# Patient Record
Sex: Female | Born: 2000 | Hispanic: Yes | Marital: Married | Smoking: Never smoker
Health system: Southern US, Community
[De-identification: ages and names within clinical notes are randomized; demographics above are authoritative.]

## PROBLEM LIST (undated history)

## (undated) DIAGNOSIS — O169 Unspecified maternal hypertension, unspecified trimester: Secondary | ICD-10-CM

---

## 2021-03-03 ENCOUNTER — Encounter (HOSPITAL_BASED_OUTPATIENT_CLINIC_OR_DEPARTMENT_OTHER): Payer: Self-pay | Admitting: Urology

## 2021-03-03 ENCOUNTER — Emergency Department (HOSPITAL_BASED_OUTPATIENT_CLINIC_OR_DEPARTMENT_OTHER): Payer: Medicaid Other

## 2021-03-03 ENCOUNTER — Emergency Department (HOSPITAL_BASED_OUTPATIENT_CLINIC_OR_DEPARTMENT_OTHER)
Admission: EM | Admit: 2021-03-03 | Discharge: 2021-03-03 | Disposition: A | Discharge status: Home / Self Care | Payer: Medicaid Other | Attending: Emergency Medicine | Admitting: Emergency Medicine

## 2021-03-03 ENCOUNTER — Other Ambulatory Visit: Payer: Self-pay

## 2021-03-03 DIAGNOSIS — N760 Acute vaginitis: Secondary | ICD-10-CM | POA: Insufficient documentation

## 2021-03-03 DIAGNOSIS — K529 Noninfective gastroenteritis and colitis, unspecified: Secondary | ICD-10-CM | POA: Diagnosis not present

## 2021-03-03 DIAGNOSIS — B9689 Other specified bacterial agents as the cause of diseases classified elsewhere: Secondary | ICD-10-CM

## 2021-03-03 DIAGNOSIS — D72829 Elevated white blood cell count, unspecified: Secondary | ICD-10-CM | POA: Insufficient documentation

## 2021-03-03 DIAGNOSIS — R1032 Left lower quadrant pain: Secondary | ICD-10-CM

## 2021-03-03 DIAGNOSIS — R102 Pelvic and perineal pain: Secondary | ICD-10-CM | POA: Diagnosis present

## 2021-03-03 LAB — CBC WITH DIFFERENTIAL/PLATELET
Abs Immature Granulocytes: 0.02 10*3/uL (ref 0.00–0.07)
Basophils Absolute: 0.1 10*3/uL (ref 0.0–0.1)
Basophils Relative: 1 %
Eosinophils Absolute: 0.1 10*3/uL (ref 0.0–0.5)
Eosinophils Relative: 2 %
HCT: 38.7 % (ref 36.0–46.0)
Hemoglobin: 12.4 g/dL (ref 12.0–15.0)
Immature Granulocytes: 0 %
Lymphocytes Relative: 45 %
Lymphs Abs: 3.6 10*3/uL (ref 0.7–4.0)
MCH: 20.7 pg — ABNORMAL LOW (ref 26.0–34.0)
MCHC: 32 g/dL (ref 30.0–36.0)
MCV: 64.7 fL — ABNORMAL LOW (ref 80.0–100.0)
Monocytes Absolute: 0.5 10*3/uL (ref 0.1–1.0)
Monocytes Relative: 6 %
Neutro Abs: 3.7 10*3/uL (ref 1.7–7.7)
Neutrophils Relative %: 46 %
Platelets: 352 10*3/uL (ref 150–400)
RBC: 5.98 MIL/uL — ABNORMAL HIGH (ref 3.87–5.11)
RDW: 17.6 % — ABNORMAL HIGH (ref 11.5–15.5)
WBC: 7.9 10*3/uL (ref 4.0–10.5)
nRBC: 0 % (ref 0.0–0.2)

## 2021-03-03 LAB — COMPREHENSIVE METABOLIC PANEL
ALT: 15 U/L (ref 0–44)
AST: 19 U/L (ref 15–41)
Albumin: 4.1 g/dL (ref 3.5–5.0)
Alkaline Phosphatase: 83 U/L (ref 38–126)
Anion gap: 9 (ref 5–15)
BUN: 10 mg/dL (ref 6–20)
CO2: 23 mmol/L (ref 22–32)
Calcium: 9 mg/dL (ref 8.9–10.3)
Chloride: 102 mmol/L (ref 98–111)
Creatinine, Ser: 0.66 mg/dL (ref 0.44–1.00)
GFR, Estimated: >60 mL/min (ref >60)
Glucose, Bld: 100 mg/dL — ABNORMAL HIGH (ref 70–99)
Potassium: 3.7 mmol/L (ref 3.5–5.1)
Sodium: 134 mmol/L — ABNORMAL LOW (ref 135–145)
Total Bilirubin: 0.5 mg/dL (ref 0.3–1.2)
Total Protein: 7.5 g/dL (ref 6.5–8.1)

## 2021-03-03 LAB — URINALYSIS, ROUTINE W REFLEX MICROSCOPIC
Bilirubin Urine: NEGATIVE
Glucose, UA: NEGATIVE mg/dL
Hgb urine dipstick: NEGATIVE
Ketones, ur: NEGATIVE mg/dL
Nitrite: NEGATIVE
Protein, ur: NEGATIVE mg/dL
Specific Gravity, Urine: 1.015 (ref 1.005–1.030)
pH: 7.5 (ref 5.0–8.0)

## 2021-03-03 LAB — WET PREP, GENITAL
Sperm: NONE SEEN
Trich, Wet Prep: NONE SEEN
WBC, Wet Prep HPF POC: >=10 — AB (ref <10)
Yeast Wet Prep HPF POC: NONE SEEN

## 2021-03-03 LAB — URINALYSIS, MICROSCOPIC (REFLEX): RBC / HPF: NONE SEEN RBC/hpf (ref 0–5)

## 2021-03-03 LAB — PREGNANCY, URINE: Preg Test, Ur: NEGATIVE

## 2021-03-03 MED ORDER — METRONIDAZOLE 500 MG PO TABS: 500.0000 mg | ORAL_TABLET | Freq: Two times a day (BID) | ORAL | 0 refills | Status: AC

## 2021-03-03 MED ORDER — IOHEXOL 300 MG/ML  SOLN
100.0000 mL | Freq: Once | INTRAMUSCULAR | Status: AC | PRN
  Administered 2021-03-03: 100 mL via INTRAVENOUS

## 2021-03-03 MED ORDER — MORPHINE SULFATE (PF) 4 MG/ML IV SOLN
4.0000 mg | Freq: Once | INTRAVENOUS | Status: AC
Start: 2021-03-03 — End: 2021-03-03
  Administered 2021-03-03: 4 mg via INTRAVENOUS
  Filled 2021-03-03: qty 1

## 2021-03-03 MED ORDER — SODIUM CHLORIDE 0.9 % IV BOLUS
1000.0000 mL | Freq: Once | INTRAVENOUS | Status: AC
  Administered 2021-03-03: 1000 mL via INTRAVENOUS

## 2021-03-03 MED ORDER — METRONIDAZOLE 500 MG PO TABS
500.0000 mg | ORAL_TABLET | Freq: Two times a day (BID) | ORAL | 0 refills | Status: DC
Start: 2021-03-03 — End: 2021-03-03

## 2021-03-03 MED ORDER — CIPROFLOXACIN HCL 500 MG PO TABS
500.0000 mg | ORAL_TABLET | Freq: Two times a day (BID) | ORAL | 0 refills | Status: AC
Start: 2021-03-03 — End: 2021-03-13

## 2021-03-03 MED ORDER — METRONIDAZOLE 500 MG PO TABS
500.0000 mg | ORAL_TABLET | Freq: Once | ORAL | Status: AC
Start: 2021-03-03 — End: 2021-03-03
  Administered 2021-03-03: 500 mg via ORAL
  Filled 2021-03-03: qty 1

## 2021-03-03 MED ORDER — ONDANSETRON HCL 4 MG/2ML IJ SOLN
4.0000 mg | Freq: Once | INTRAMUSCULAR | Status: AC
  Administered 2021-03-03: 4 mg via INTRAVENOUS
  Filled 2021-03-03: qty 2

## 2021-03-03 NOTE — ED Provider Notes (Signed)
[facility] Provider Note   CSN: [id number] Arrival date & time: 03/03/21  2028     History  Chief Complaint  Patient presents with   Pelvic Pain    Kristy Mitchell is a 21 y.o. female.  HPI  Patient is a 21 year old female with 3 days of left sided pelvic pain.  She has not tried anything for this pain.  Never had pain like this before.  Pain is described as severe with movement and is sharp.  Her last menstrual cycle was normal duration and flow.  LMP 02/26/2021.  She is sexually active with her husband and they are trying to conceive.  Endorses nausea.  She is unsure if she is pregnant.  Denies vomiting, nausea, dysuria, constipation, back pain, flank pain, blood in her urine or stool and fever.  Notes that she does have pain with sex in certain positions.  She has at least one soft stool daily. There has been no vaginal discharge, vaginal bleeding or rectal pain. She is not concerned for STDs.   Has a headache that she is taking Excedrin with some relief.      Home Medications Prior to Admission medications   Medication Sig Start Date End Date Taking? Authorizing Provider  ciprofloxacin (CIPRO) 500 MG tablet Take 1 tablet (500 mg total) by mouth 2 (two) times daily for 10 days. 03/03/21 03/13/21 Yes Adalyna Godbee, DO  metroNIDAZOLE (FLAGYL) 500 MG tablet Take 1 tablet (500 mg total) by mouth 2 (two) times daily for 10 days. 03/03/21 03/13/21  Lyndee Hensen, DO      Allergies    Patient has no known allergies.    Review of Systems   Review of Systems  Constitutional:  Negative for appetite change and fever.  HENT:  Negative for sore throat.   Respiratory:  Negative for cough and shortness of breath.   Cardiovascular:  Negative for chest pain.  Gastrointestinal:  Positive for nausea. Negative for abdominal pain, constipation, diarrhea, rectal pain and vomiting.  Genitourinary:  Positive for dyspareunia and pelvic pain. Negative for dysuria,  flank pain, hematuria, vaginal bleeding and vaginal discharge.  Musculoskeletal:  Negative for back pain.  Skin:  Negative for rash.  Neurological:  Positive for headaches.  All other systems reviewed and are negative.  Physical Exam Updated Vital Signs BP (!) 113/93    Pulse 89    Temp 98 F (36.7 C) (Oral)    Resp 20    LMP 02/26/2021 (Exact Date)    SpO2 100%   Physical Exam Constitutional:      Appearance: Normal appearance. She is obese. She is not ill-appearing.  HENT:     Head: Normocephalic and atraumatic.     Nose: Nose normal.     Mouth/Throat:     Mouth: Mucous membranes are moist.     Pharynx: Oropharynx is clear.  Eyes:     General: No scleral icterus.    Extraocular Movements: Extraocular movements intact.     Conjunctiva/sclera: Conjunctivae normal.  Cardiovascular:     Rate and Rhythm: Normal rate and regular rhythm.     Pulses: Normal pulses.     Heart sounds: Normal heart sounds.  Pulmonary:     Effort: Pulmonary effort is normal. No respiratory distress.     Breath sounds: Normal breath sounds.  Abdominal:     General: Bowel sounds are normal.     Palpations: Abdomen is soft. There is no fluid wave or mass.  Tenderness: There is abdominal tenderness in the left upper quadrant and left lower quadrant. There is no right CVA tenderness or left CVA tenderness. Negative signs include McBurney's sign.  Genitourinary:    Comments: Off-white discharge in the vaginal vault, no CMT, no lesions, no adnexal masses  Musculoskeletal:        General: No tenderness. Normal range of motion.     Cervical back: Normal range of motion.  Skin:    General: Skin is warm and dry.     Capillary Refill: Capillary refill takes less than 2 seconds.     Coloration: Skin is not jaundiced.     Findings: No rash.  Neurological:     Mental Status: She is alert and oriented to person, place, and time.  Psychiatric:        Mood and Affect: Mood normal.        Behavior: Behavior  normal.    ED Results / Procedures / Treatments   Labs (all labs ordered are listed, but only abnormal results are displayed) Labs Reviewed  WET PREP, GENITAL - Abnormal; Notable for the following components:      Result Value   Clue Cells Wet Prep HPF POC PRESENT (*)    WBC, Wet Prep HPF POC >=10 (*)    All other components within normal limits  URINALYSIS, ROUTINE W REFLEX MICROSCOPIC - Abnormal; Notable for the following components:   APPearance HAZY (*)    Leukocytes,Ua SMALL (*)    All other components within normal limits  CBC WITH DIFFERENTIAL/PLATELET - Abnormal; Notable for the following components:   RBC 5.98 (*)    MCV 64.7 (*)    MCH 20.7 (*)    RDW 17.6 (*)    All other components within normal limits  COMPREHENSIVE METABOLIC PANEL - Abnormal; Notable for the following components:   Sodium 134 (*)    Glucose, Bld 100 (*)    All other components within normal limits  URINALYSIS, MICROSCOPIC (REFLEX) - Abnormal; Notable for the following components:   Bacteria, UA RARE (*)    All other components within normal limits  PREGNANCY, URINE  GC/CHLAMYDIA PROBE AMP () NOT AT [facility]    EKG None  Radiology CT ABDOMEN PELVIS W CONTRAST  Result Date: 03/03/2021 CLINICAL DATA:  Left lower quadrant pain for several days, initial encounter EXAM: CT ABDOMEN AND PELVIS WITH CONTRAST TECHNIQUE: Multidetector CT imaging of the abdomen and pelvis was performed using the standard protocol following bolus administration of intravenous contrast. RADIATION DOSE REDUCTION: This exam was performed according to the departmental dose-optimization program which includes automated exposure control, adjustment of the mA and/or kV according to patient size and/or use of iterative reconstruction technique. CONTRAST:  113mL OMNIPAQUE IOHEXOL 300 MG/ML  SOLN COMPARISON:  None. FINDINGS: Lower chest: No acute abnormality. Hepatobiliary: Mild fatty infiltration of the liver is noted. The  gallbladder is within normal limits. Pancreas: Unremarkable. No pancreatic ductal dilatation or surrounding inflammatory changes. Spleen: Normal in size without focal abnormality. Adrenals/Urinary Tract: The adrenal glands are unremarkable. Kidneys demonstrate a normal enhancement pattern bilaterally. No renal calculi or obstructive changes are seen. Ureters are within normal limits. The bladder is well distended. Stomach/Bowel: The appendix is well visualized and within normal limits. No obstructive or inflammatory changes of the colon are seen. Stomach is within normal limits. Small bowel demonstrates no obstructive changes although some very mild wall thickening is noted in the left mid abdomen in the jejunum which may represent a degree  of enteritis. The more distal small bowel is within normal limits. Vascular/Lymphatic: No significant vascular findings are present. No enlarged abdominal or pelvic lymph nodes. Reproductive: Uterus and bilateral adnexa are unremarkable. Other: No abdominal wall hernia or abnormality. No abdominopelvic ascites. Musculoskeletal: No acute or significant osseous findings. IMPRESSION: Mild wall thickening within the jejunum which may represent some mild enteritis. Fatty infiltration of the liver. No other focal abnormality is noted. Electronically Signed   By: Inez Catalina M.D.   On: 03/03/2021 23:05   US Renal  Result Date: 03/03/2021 CLINICAL DATA:  Left flank pain. EXAM: RENAL / URINARY TRACT ULTRASOUND COMPLETE COMPARISON:  None. FINDINGS: Right Kidney: Renal measurements: 10.1 x 4.9 x 4.6 cm = volume: 11.9 mL. Echogenicity within normal limits. No mass or hydronephrosis visualized. Left Kidney: Renal measurements: 10.4 x 4.9 x 4.8 cm = volume: 129 mL. Echogenicity within normal limits. No mass or hydronephrosis visualized. Bladder: Appears normal for degree of bladder distention. Other: None. IMPRESSION: Normal renal ultrasound. Electronically Signed   By: Ronney Asters M.D.    On: 03/03/2021 22:12   US PELVIC COMPLETE W TRANSVAGINAL AND TORSION R/O  Result Date: 03/03/2021 CLINICAL DATA:  Initial evaluation for acute left lower quadrant pain. EXAM: TRANSABDOMINAL AND TRANSVAGINAL ULTRASOUND OF PELVIS DOPPLER ULTRASOUND OF OVARIES TECHNIQUE: Both transabdominal and transvaginal ultrasound examinations of the pelvis were performed. Transabdominal technique was performed for global imaging of the pelvis including uterus, ovaries, adnexal regions, and pelvic cul-de-sac. It was necessary to proceed with endovaginal exam following the transabdominal exam to visualize the endometrium and ovaries. Color and duplex Doppler ultrasound was utilized to evaluate blood flow to the ovaries. COMPARISON:  None available. FINDINGS: Uterus Measurements: 5.9 x 3.4 x 5.3 cm = volume: 55.0 mL. Uterus is retroverted. No discrete fibroid or other myometrial abnormality. Endometrium Thickness: 6.7 mm.  No focal abnormality visualized. Right ovary Measurements: 3.4 x 2.0 x 1.1 cm = volume: 3.9 mL. Normal appearance/no adnexal mass. Left ovary Measurements: 3.2 x 1.7 x 2.1 cm = volume: 6.0 mL. Normal appearance/no adnexal mass. Pulsed Doppler evaluation of both ovaries demonstrates normal low-resistance arterial and venous waveforms. Other findings No abnormal free fluid. IMPRESSION: Normal pelvic ultrasound. No evidence for torsion or other acute abnormality. Electronically Signed   By: Jeannine Boga M.D.   On: 03/03/2021 22:20    Procedures   Medications Ordered in ED Medications  sodium chloride 0.9 % bolus 1,000 mL (1,000 mLs Intravenous New Bag/Given 03/03/21 2110)  morphine 4 MG/ML injection 4 mg (4 mg Intravenous Given 03/03/21 2112)  ondansetron (ZOFRAN) injection 4 mg (4 mg Intravenous Given 03/03/21 2112)  iohexol (OMNIPAQUE) 300 MG/ML solution 100 mL (100 mLs Intravenous Contrast Given 03/03/21 2250)  metroNIDAZOLE (FLAGYL) tablet 500 mg (500 mg Oral Given 03/03/21 2316)    ED  Course/ Medical Decision Making/ A&P                           Medical Decision Making Amount and/or Complexity of Data Reviewed Independent Historian: spouse External Data Reviewed: labs and notes. Labs: ordered. Radiology: ordered. ECG/medicine tests: ordered.  Risk Prescription drug management.    Pt is a 21 yo female with 3 days of worsening pelvic pain.  DDX includes ovarian cyst, ovarian torsion, STI, pregnancy, nephrolithiasis, renal colic. Pain control with morphine. Provided fluid bolus and Zofran.    No leukocytosis on CBC. Profound microcytosis seen on previous labs. Urine pregnancy negative and no IUP  seen on ultrasound. UA dirty catch showing small amount of leukocytes. Serum creatine normal. Renal and pelvic ultrasound unremarkable.  GC/CT collected. Wet prep consistent with bacterial vaginosis. CT ABD/Pelvis concerning for colitis. Will treat with Cipro and Flagyl. Follow up with PCP as needed. Pt agrees with plan.    Final Clinical Impression(s) / ED Diagnoses Final diagnoses:  Colitis  BV (bacterial vaginosis)    Rx / DC Orders ED Discharge Orders          Ordered    metroNIDAZOLE (FLAGYL) 500 MG tablet  2 times daily,   Status:  Discontinued        03/03/21 2315    ciprofloxacin (CIPRO) 500 MG tablet  2 times daily        03/03/21 2315    metroNIDAZOLE (FLAGYL) 500 MG tablet  2 times daily        03/03/21 2316              Lyndee Hensen, DO 03/03/21 2320    Drenda Freeze, MD 03/10/21 [phone]

## 2021-03-03 NOTE — ED Triage Notes (Signed)
"  Pain in my left ovary" x 3 days Denies any dysuria  Denies vaginal discharge Normal BM

## 2021-03-03 NOTE — Discharge Instructions (Addendum)
Stop by the pharmacy to pick up your antibiotics. Take twice a day for the next 10 days. Follow up with your PCP as needed.   Best wishes on getting pregnant soon!   Take Care,   Dr Rachael Darby

## 2021-03-05 LAB — GC/CHLAMYDIA PROBE AMP (~~LOC~~) NOT AT ARMC
Chlamydia: NEGATIVE
Comment: NEGATIVE
Comment: NORMAL
Neisseria Gonorrhea: NEGATIVE

## 2021-03-15 ENCOUNTER — Ambulatory Visit: Payer: Medicaid Other | Admitting: Internal Medicine

## 2021-03-15 NOTE — Progress Notes (Signed)
Pt did not show for scheduled appointment.  

## 2021-07-17 ENCOUNTER — Encounter (HOSPITAL_BASED_OUTPATIENT_CLINIC_OR_DEPARTMENT_OTHER): Payer: Self-pay | Admitting: Emergency Medicine

## 2021-07-17 ENCOUNTER — Other Ambulatory Visit: Payer: Self-pay

## 2021-07-17 ENCOUNTER — Emergency Department (HOSPITAL_BASED_OUTPATIENT_CLINIC_OR_DEPARTMENT_OTHER)
Admission: EM | Admit: 2021-07-17 | Discharge: 2021-07-17 | Disposition: A | Discharge status: Home / Self Care | Payer: Medicaid Other | Attending: Emergency Medicine | Admitting: Emergency Medicine

## 2021-07-17 DIAGNOSIS — Z3A12 12 weeks gestation of pregnancy: Secondary | ICD-10-CM | POA: Diagnosis not present

## 2021-07-17 DIAGNOSIS — R519 Headache, unspecified: Secondary | ICD-10-CM | POA: Insufficient documentation

## 2021-07-17 DIAGNOSIS — R000 Tachycardia, unspecified: Secondary | ICD-10-CM | POA: Diagnosis not present

## 2021-07-17 DIAGNOSIS — O26891 Other specified pregnancy related conditions, first trimester: Secondary | ICD-10-CM | POA: Diagnosis present

## 2021-07-17 HISTORY — DX: Unspecified maternal hypertension, unspecified trimester: O16.9

## 2021-07-17 MED ORDER — PYRIDOXINE HCL 100 MG/ML IJ SOLN
100.0000 mg | Freq: Once | INTRAMUSCULAR | Status: AC
  Administered 2021-07-17: 100 mg via INTRAVENOUS
  Filled 2021-07-17: qty 1

## 2021-07-17 MED ORDER — SODIUM CHLORIDE 0.9 % IV BOLUS
500.0000 mL | Freq: Once | INTRAVENOUS | Status: AC
  Administered 2021-07-17: 500 mL via INTRAVENOUS

## 2021-07-17 NOTE — ED Provider Notes (Signed)
[facility] Provider Note   CSN: [id number] Arrival date & time: 07/17/21  1150     History  Chief Complaint  Patient presents with   Headache    Kristy Mitchell is a 21 y.o. female with a past medical history of pretension and pregnancy presenting today with headache.  She is [redacted] weeks pregnant.  She called her OB/GYN who told her to come to the emergency department to get IV fluids and treatment for her headache.  She tells me the headache has been going on for 2 days, is not worsening but is not going away.  She has also been nauseated and had multiple episodes of emesis.  Has not taken any medications and says she is not taking Tylenol because of a lawsuit.   Headache     Home Medications Prior to Admission medications   Not on File      Allergies    Patient has no known allergies.    Review of Systems   Review of Systems  Neurological:  Positive for headaches.   Physical Exam Updated Vital Signs BP 134/74 (BP Location: Right Arm)   Pulse (!) 102   Temp 98.6 F (37 C) (Oral)   Resp 20   Ht 5' (1.524 m)   Wt 84.8 kg   LMP 02/26/2021 (Exact Date)   SpO2 100%   BMI 36.52 kg/m  Physical Exam Vitals and nursing note reviewed.  Constitutional:      General: She is not in acute distress.    Appearance: Normal appearance.  HENT:     Head: Normocephalic and atraumatic.  Eyes:     General: No scleral icterus.    Extraocular Movements:     Right eye: Normal extraocular motion.     Left eye: Normal extraocular motion.     Conjunctiva/sclera: Conjunctivae normal.  Cardiovascular:     Rate and Rhythm: Regular rhythm. Tachycardia present.  Pulmonary:     Effort: Pulmonary effort is normal. No respiratory distress.  Abdominal:     Palpations: Abdomen is soft.     Tenderness: There is no abdominal tenderness.  Skin:    General: Skin is warm and dry.     Findings: No rash.  Neurological:     Mental Status: She is alert and  oriented to person, place, and time.     GCS: GCS eye subscore is 4. GCS verbal subscore is 5. GCS motor subscore is 6.     Cranial Nerves: No cranial nerve deficit, dysarthria or facial asymmetry.  Psychiatric:        Mood and Affect: Mood normal.        Behavior: Behavior normal.    ED Results / Procedures / Treatments   Labs (all labs ordered are listed, but only abnormal results are displayed) Labs Reviewed - No data to display  EKG None  Radiology No results found.  Procedures Procedures   Medications Ordered in ED Medications  sodium chloride 0.9 % bolus 500 mL (500 mLs Intravenous New Bag/Given 07/17/21 1249)  pyridOXINE (B-6) injection 100 mg (100 mg Intravenous Given 07/17/21 1250)    ED Course/ Medical Decision Making/ A&P                           Medical Decision Making Risk Prescription drug management.   This patient presents to the ED for concern of headache in pregnancy.  Differential includes but is not limited to  preeclampsia/eclampsia, dehydration, electrolyte abnormality, meningitis, subarachnoid hemorrhage, meningitis,cerebral ischemia, carotid/vertebral dissection, intracranial tumor, acute or chronic subdural hemorrhage.   This is not an exhaustive differential.    Past Medical History / Co-morbidities / Social History: Hypertension in pregnancy   Physical Exam: Physical exam performed. The pertinent findings include:  Bedside ultrasound confirms IUP with normal cardiac rhythm.  Normal blood pressure and no lower extremity edema.  Lab Tests: I ordered, and personally interpreted labs.  The pertinent results include: None ordered   Imaging Studies: None ordered, able to perform bedside transabdominal ultrasound    Medications: I ordered medication including fluids and B6. Reevaluation of the patient after these medicines showed that the patient improved. I have reviewed the patients home medicines and have made adjustments as needed.    Disposition: After consideration of the diagnostic results and the patients response to treatment, I feel that patient may be discharged home.  She is requesting discharge at this time and states that she feels better.  Has an appointment with OB/GYN on Monday.  Discharged in ambulatory, neurologically intact condition.  Was never hypertensive throughout her emergency stay, no concern for preeclampsia.    Final Clinical Impression(s) / ED Diagnoses Final diagnoses:  Pregnancy headache, antepartum    Rx / DC Orders   Results and diagnoses were explained to the patient. Return precautions discussed in full. Patient had no additional questions and expressed complete understanding.   This chart was dictated using voice recognition software.  Despite best efforts to proofread,  errors can occur which can change the documentation meaning.    Saddie Benders, PA-C 07/17/21 1340    Rolan Bucco, MD 07/17/21 1416

## 2021-07-17 NOTE — Discharge Instructions (Signed)
Keep your appointment with your OB/GYN on Monday.  You may use Tylenol for headaches as well as B6.

## 2021-07-17 NOTE — ED Triage Notes (Signed)
Pt reports frontal HA since yesterday; [redacted] wks pregnant

## 2021-09-02 ENCOUNTER — Other Ambulatory Visit: Payer: Self-pay

## 2021-09-02 ENCOUNTER — Encounter (HOSPITAL_BASED_OUTPATIENT_CLINIC_OR_DEPARTMENT_OTHER): Payer: Self-pay

## 2021-09-02 ENCOUNTER — Emergency Department (HOSPITAL_BASED_OUTPATIENT_CLINIC_OR_DEPARTMENT_OTHER)
Admission: EM | Admit: 2021-09-02 | Discharge: 2021-09-02 | Disposition: A | Discharge status: Home / Self Care | Payer: Medicaid Other | Attending: Emergency Medicine | Admitting: Emergency Medicine

## 2021-09-02 DIAGNOSIS — R519 Headache, unspecified: Secondary | ICD-10-CM | POA: Insufficient documentation

## 2021-09-02 DIAGNOSIS — O26892 Other specified pregnancy related conditions, second trimester: Secondary | ICD-10-CM | POA: Insufficient documentation

## 2021-09-02 DIAGNOSIS — Z3A19 19 weeks gestation of pregnancy: Secondary | ICD-10-CM | POA: Diagnosis not present

## 2021-09-02 NOTE — Discharge Instructions (Signed)
Please try and drink plenty of fluids.  Call your OB/GYN on the phone and let them know what is going on with you.  See when they can see you in the office.  At any time he would like to be reassessed please return the emergency department and we would be happy to take another look at you.  Please return as well for worsening headache one-sided numbness or weakness difficulty speech or swallowing.  Return for abdominal pain vaginal bleeding gush of fluids.

## 2021-09-02 NOTE — ED Notes (Signed)
ED Provider at bedside. 

## 2021-09-02 NOTE — ED Provider Notes (Signed)
[facility] Provider Note   CSN: [id number] Arrival date & time: 09/02/21  0847     History  Chief Complaint  Kristy [facility] presents with   Headache    5 mos pregnant    Kristy Mitchell is a 21 y.o. female.  21 yo F with a chief complaint of a headache.  This been going on for about 3 days now.  Kristy Mitchell feels like its all over her head.  To her it feels similar to when she was pregnant with her last child.  At that point she was diagnosed with preeclampsia and she went into the hospital and was induced.  She tells me that she is 19 weeks by ultrasound that was done in her doctor's office.  She denies any abdominal pain denies vaginal bleeding or discharge.  She is having some spots in her vision.  She denies one-sided numbness or weakness denies difficulty speech or swallowing.  Denies head trauma denies neck trauma.  She has a history of headaches but she thinks this feels different.   Headache      Home Medications Prior to Admission medications   Not on File      Allergies    Kristy Mitchell has no known allergies.    Review of Systems   Review of Systems  Neurological:  Positive for headaches.    Physical Exam Updated Vital Signs BP 118/65   Pulse 76   Resp 16   Ht 5' (1.524 m)   Wt 85 kg   LMP 02/26/2021 (Exact Date)   SpO2 99%   BMI 36.60 kg/m  Physical Exam Vitals and nursing note reviewed.  Constitutional:      General: She is not in acute distress.    Appearance: She is well-developed. She is not diaphoretic.  HENT:     Head: Normocephalic and atraumatic.  Eyes:     Pupils: Pupils are equal, round, and reactive to light.  Cardiovascular:     Rate and Rhythm: Normal rate and regular rhythm.     Heart sounds: No murmur heard.    No friction rub. No gallop.  Pulmonary:     Effort: Pulmonary effort is normal.     Breath sounds: No wheezing or rales.  Abdominal:     General: There is no distension.     Palpations: Abdomen is  soft.     Tenderness: There is no abdominal tenderness.  Musculoskeletal:        General: No tenderness.     Cervical back: Normal range of motion and neck supple.  Skin:    General: Skin is warm and dry.  Neurological:     Mental Status: She is alert and oriented to person, place, and time.     GCS: GCS eye subscore is 4. GCS verbal subscore is 5. GCS motor subscore is 6.     Cranial Nerves: Cranial nerves 2-12 are intact.     Sensory: Sensation is intact.     Motor: Motor function is intact.     Coordination: Coordination is intact.     Gait: Gait is intact.     Comments: Kristy Mitchell ambulates without issue.  Benign neurologic exam.  Psychiatric:        Behavior: Behavior normal.     ED Results / Procedures / Treatments   Labs (all labs ordered are listed, but only abnormal results are displayed) Labs Reviewed - No data to display  EKG None  Radiology No results found.  Procedures Procedures    Medications Ordered in ED Medications - No data to display  ED Course/ Medical Decision Making/ A&P                           Medical Decision Making  21 yo F G2P1001 at 18wk 6 days by ultrasound seen in the [facility] system, comes in with a chief complaint of a headache.  Been going on for 3 days.  She is complaining of some intermittent blurry vision.  She has benign neurologic exam here.  Her blood pressure is normal.  She seems a bit frustrated that she has not been able to be seen by her OB/GYN.  She denies any specific pregnancy related complaints.  She tells me that this is similar to when she had preeclampsia in the past.  I feel this is unlikely to be preeclampsia as she is less than [redacted] weeks gestation.  Her blood pressure is also normal.  She has a benign neurologic exam.  I offered a headache cocktail and IV fluids which she declined.  She would prefer to try and follow-up with her OB/GYN in the office.  I encouraged her to return anytime she wished to be  reevaluated.  9:20 AM:  I have discussed the diagnosis/risks/treatment options with the Kristy Mitchell.  Evaluation and diagnostic testing in the emergency department does not suggest an emergent condition requiring admission or immediate intervention beyond what has been performed at this time.  They will follow up with OB. We also discussed returning to the ED immediately if new or worsening sx occur. We discussed the sx which are most concerning (e.g., sudden worsening pain, fever, inability to tolerate by mouth, vaginal bleeding, abdominal pain, stroke s/sx) that necessitate immediate return. Medications administered to the Kristy Mitchell during their visit and any new prescriptions provided to the Kristy Mitchell are listed below.  Medications given during this visit Medications - No data to display   The Kristy Mitchell appears reasonably screen and/or stabilized for discharge and I doubt any other medical condition or other [facility] requiring further screening, evaluation, or treatment in the ED at this time prior to discharge.          Final Clinical Impression(s) / ED Diagnoses Final diagnoses:  Pregnancy headache in second trimester    Rx / DC Orders ED Discharge Orders     None         Melene Plan, DO 09/02/21 0920

## 2021-09-02 NOTE — ED Triage Notes (Signed)
Pt states headache, fatigue, intermittent blurry vision.  5 months pregnant.  Denies abdominal pain.  States called obgyn but feels she is being ignored.  Hx HTN with pregnancies

## 2021-09-02 NOTE — ED Notes (Addendum)
Per Dr Adela Lank [redacted] weeks gestation verified by u/s 2 weeks ago

## 2021-09-02 NOTE — ED Notes (Signed)
No OB  monitoring indicated at this time per Dr Adela Lank

## 2022-08-01 ENCOUNTER — Other Ambulatory Visit: Payer: Self-pay

## 2022-08-01 ENCOUNTER — Encounter (HOSPITAL_BASED_OUTPATIENT_CLINIC_OR_DEPARTMENT_OTHER): Payer: Self-pay | Admitting: Emergency Medicine

## 2022-08-01 ENCOUNTER — Emergency Department (HOSPITAL_BASED_OUTPATIENT_CLINIC_OR_DEPARTMENT_OTHER)
Admission: EM | Admit: 2022-08-01 | Discharge: 2022-08-01 | Disposition: A | Discharge status: Home / Self Care | Payer: Medicaid Other | Attending: Emergency Medicine | Admitting: Emergency Medicine

## 2022-08-01 ENCOUNTER — Emergency Department (HOSPITAL_BASED_OUTPATIENT_CLINIC_OR_DEPARTMENT_OTHER): Payer: Medicaid Other

## 2022-08-01 ENCOUNTER — Other Ambulatory Visit (HOSPITAL_BASED_OUTPATIENT_CLINIC_OR_DEPARTMENT_OTHER): Payer: Self-pay

## 2022-08-01 DIAGNOSIS — R079 Chest pain, unspecified: Secondary | ICD-10-CM | POA: Diagnosis present

## 2022-08-01 DIAGNOSIS — K2900 Acute gastritis without bleeding: Secondary | ICD-10-CM | POA: Diagnosis not present

## 2022-08-01 LAB — BASIC METABOLIC PANEL
Anion gap: 7 (ref 5–15)
BUN: 11 mg/dL (ref 6–20)
CO2: 21 mmol/L — ABNORMAL LOW (ref 22–32)
Calcium: 8.5 mg/dL — ABNORMAL LOW (ref 8.9–10.3)
Chloride: 108 mmol/L (ref 98–111)
Creatinine, Ser: 0.62 mg/dL (ref 0.44–1.00)
GFR, Estimated: >60 mL/min (ref >60)
Glucose, Bld: 111 mg/dL — ABNORMAL HIGH (ref 70–99)
Potassium: 3.5 mmol/L (ref 3.5–5.1)
Sodium: 136 mmol/L (ref 135–145)

## 2022-08-01 LAB — CBC
HCT: 36.8 % (ref 36.0–46.0)
Hemoglobin: 11.4 g/dL — ABNORMAL LOW (ref 12.0–15.0)
MCH: 19.2 pg — ABNORMAL LOW (ref 26.0–34.0)
MCHC: 31 g/dL (ref 30.0–36.0)
MCV: 62 fL — ABNORMAL LOW (ref 80.0–100.0)
Platelets: 351 10*3/uL (ref 150–400)
RBC: 5.94 MIL/uL — ABNORMAL HIGH (ref 3.87–5.11)
RDW: 20.4 % — ABNORMAL HIGH (ref 11.5–15.5)
WBC: 5.2 10*3/uL (ref 4.0–10.5)
nRBC: 0 % (ref 0.0–0.2)

## 2022-08-01 LAB — PREGNANCY, URINE: Preg Test, Ur: NEGATIVE

## 2022-08-01 LAB — HEPATIC FUNCTION PANEL
ALT: 22 U/L (ref 0–44)
AST: 21 U/L (ref 15–41)
Albumin: 3.8 g/dL (ref 3.5–5.0)
Alkaline Phosphatase: 69 U/L (ref 38–126)
Bilirubin, Direct: <0.1 mg/dL (ref 0.0–0.2)
Total Bilirubin: 0.7 mg/dL (ref 0.3–1.2)
Total Protein: 7.2 g/dL (ref 6.5–8.1)

## 2022-08-01 LAB — LIPASE, BLOOD: Lipase: 33 U/L (ref 11–51)

## 2022-08-01 MED ORDER — ONDANSETRON HCL 4 MG/2ML IJ SOLN
4.0000 mg | Freq: Once | INTRAMUSCULAR | Status: AC
  Administered 2022-08-01: 4 mg via INTRAVENOUS
  Filled 2022-08-01: qty 2

## 2022-08-01 MED ORDER — ALUM & MAG HYDROXIDE-SIMETH 200-200-20 MG/5ML PO SUSP
30.0000 mL | Freq: Once | ORAL | Status: AC
  Administered 2022-08-01: 30 mL via ORAL
  Filled 2022-08-01: qty 30

## 2022-08-01 MED ORDER — FAMOTIDINE 40 MG PO TABS
40.0000 mg | ORAL_TABLET | Freq: Every day | ORAL | 0 refills | Status: AC
  Filled 2022-08-01: qty 30, 30d supply, fill #0

## 2022-08-01 MED ORDER — SUCRALFATE 1 GM/10ML PO SUSP
1.0000 g | Freq: Four times a day (QID) | ORAL | 0 refills | Status: AC | PRN
  Filled 2022-08-01: qty 50, 2d supply, fill #0
  Filled 2022-08-01: qty 370, 9d supply, fill #0
  Filled 2022-08-01: qty 420, 11d supply, fill #0

## 2022-08-01 MED ORDER — KETOROLAC TROMETHAMINE 15 MG/ML IJ SOLN
15.0000 mg | Freq: Once | INTRAMUSCULAR | Status: AC
  Administered 2022-08-01: 15 mg via INTRAVENOUS
  Filled 2022-08-01: qty 1

## 2022-08-01 NOTE — Discharge Instructions (Signed)
We evaluated you for your chest and abdominal pain.  Your symptoms are most likely due to inflammation in your stomach (gastritis).  We obtained other testing including laboratory test to evaluate your liver and your pancreas as well as an ultrasound to evaluate your gallbladder.  This testing did not show any signs of other dangerous causes of abdominal pain.  Please take Pepcid daily every night to help prevent acid buildup.  I have also prescribed you a medication called Carafate which you can take 4 times a day as needed for abdominal pain.  Please follow-up closely with your primary doctor.  If your symptoms fail to improve, you may need referral to a gastroenterologist.  Please return to the emergency department if you have any new or worsening symptoms such as severe pain, vomiting blood, black stools, fainting, uncontrolled vomiting, fevers or chills, or any other concerning symptoms.

## 2022-08-01 NOTE — ED Triage Notes (Signed)
Pt arrives pov, to triage in wheelchair with c/o gen. CP radiating bilaterally into neck, and LUQ pain starting last night. Denies n/v. Ibuprofen 800 mg ~ 0830

## 2022-08-01 NOTE — ED Notes (Signed)
Pt. Up to restroom  Explained for pregnancy urine and offered her to place her shoes on.  Pt. Said she did not need her shoes and walked barefoot to rest room

## 2022-08-01 NOTE — ED Notes (Signed)
Attempted IV x 1 in R AC, unable to obtain, did get labs, but insufficient amounts.

## 2022-08-01 NOTE — ED Provider Notes (Signed)
[place] [facility] Provider Note  CSN: [id number] Arrival date & time: 08/01/22 1025  Chief Complaint(s) Chest Pain  HPI Kristy Mitchell is a 22 y.o. female presenting to the emergency department with abdominal pain.  Patient reports abdominal pain radiating upward into her chest.  Reports mild nausea, no vomiting.  No lightheadedness or dizziness.  No urinary symptoms.  No back pain.  No diarrhea.  Reports this began yesterday and is constant.    Past Medical History Past Medical History:  Diagnosis Date   Hypertension in pregnancy    There are no problems to display for this patient.  Home Medication(s) Prior to Admission medications   Medication Sig Start Date End Date Taking? Authorizing Provider  famotidine (PEPCID) 40 MG tablet Take 1 tablet (40 mg total) by mouth at bedtime. 08/01/22  Yes Lonell Grandchild, MD  sucralfate (CARAFATE) 1 GM/10ML suspension Take 10 mLs (1 g total) by mouth 4 (four) times daily as needed (abdominal pain). 08/01/22  Yes Lonell Grandchild, MD                                                                                                                                    Past Surgical History History reviewed. No pertinent surgical history. Family History History reviewed. No pertinent family history.  Social History Social History   Tobacco Use   Smoking status: Never    Passive exposure: Never   Smokeless tobacco: Never  Substance Use Topics   Alcohol use: Never   Drug use: Never   Allergies Patient has no known allergies.  Review of Systems Review of Systems  All other systems reviewed and are negative.   Physical Exam Vital Signs  I have reviewed the triage vital signs BP 120/78   Pulse 79   Temp 98.9 F (37.2 C)   Resp 12   Ht 5\' 1"  (1.549 m)   Wt 86.4 kg   LMP 07/06/2022   SpO2 100%   BMI 35.98 kg/m  Physical Exam Vitals and nursing note reviewed.  Constitutional:       General: She is not in acute distress.    Appearance: She is well-developed.  HENT:     Head: Normocephalic and atraumatic.     Mouth/Throat:     Mouth: Mucous membranes are moist.  Eyes:     Pupils: Pupils are equal, round, and reactive to light.  Cardiovascular:     Rate and Rhythm: Normal rate and regular rhythm.     Heart sounds: No murmur heard. Pulmonary:     Effort: Pulmonary effort is normal. No respiratory distress.     Breath sounds: Normal breath sounds.  Abdominal:     General: Abdomen is flat.     Palpations: Abdomen is soft.     Tenderness: There is abdominal tenderness (mild epigastric).  Musculoskeletal:  General: No tenderness.     Right lower leg: No edema.     Left lower leg: No edema.  Skin:    General: Skin is warm and dry.  Neurological:     General: No focal deficit present.     Mental Status: She is alert. Mental status is at baseline.  Psychiatric:        Mood and Affect: Mood normal.        Behavior: Behavior normal.     ED Results and Treatments Labs (all labs ordered are listed, but only abnormal results are displayed) Labs Reviewed  BASIC METABOLIC PANEL - Abnormal; Notable for the following components:      Result Value   CO2 21 (*)    Glucose, Bld 111 (*)    Calcium 8.5 (*)    All other components within normal limits  CBC - Abnormal; Notable for the following components:   RBC 5.94 (*)    Hemoglobin 11.4 (*)    MCV 62.0 (*)    MCH 19.2 (*)    RDW 20.4 (*)    All other components within normal limits  PREGNANCY, URINE  HEPATIC FUNCTION PANEL  LIPASE, BLOOD                                                                                                                          Radiology US Abdomen Limited RUQ (LIVER/GB)  Result Date: 08/01/2022 CLINICAL DATA:  151471 RUQ pain 151471 EXAM: ULTRASOUND ABDOMEN LIMITED RIGHT UPPER QUADRANT COMPARISON:  March 03, 2021. FINDINGS: Gallbladder: No gallstones or wall  thickening visualized. No sonographic Murphy sign noted by sonographer. Common bile duct: Diameter: Visualized portion measures 2 mm, within normal limits. Liver: No focal lesion identified. Mildly increased hepatic parenchymal echogenicity. Portal vein is patent on color Doppler imaging with normal direction of blood flow towards the liver. Other: None. IMPRESSION: Mildly increased hepatic echogenicity as can be seen in hepatic steatosis. Electronically Signed   By: Meda Klinefelter M.D.   On: 08/01/2022 13:59   DG Chest 2 View  Result Date: 08/01/2022 CLINICAL DATA:  Chest pain. EXAM: CHEST - 2 VIEW COMPARISON:  None Available. FINDINGS: No consolidation, pneumothorax or effusion. No edema. Normal cardiopericardial silhouette. Overlapping cardiac leads. IMPRESSION: No acute cardiopulmonary disease. Electronically Signed   By: Karen Kays M.D.   On: 08/01/2022 11:49    Pertinent labs & imaging results that were available during my care of the patient were reviewed by me and considered in my medical decision making (see MDM for details).  Medications Ordered in ED Medications  alum & mag hydroxide-simeth (MAALOX/MYLANTA) 200-200-20 MG/5ML suspension 30 mL (30 mLs Oral Given 08/01/22 1222)  ketorolac (TORADOL) 15 MG/ML injection 15 mg (15 mg Intravenous Given 08/01/22 1345)  ondansetron (ZOFRAN) injection 4 mg (4 mg Intravenous Given 08/01/22 1345)  Procedures Procedures  (including critical care time)  Medical Decision Making / ED Course   MDM:  22 year old presenting to the emergency department with abdominal pain.  Patient well-appearing, physical exam with mild epigastric tenderness, otherwise nonfocal.  Differential includes gastritis, pancreatitis, cholecystitis or biliary colic.  Suspect most likely cause is gastritis so we will trial GI cocktail.  Will  check basic labs including lipase and hepatic function panel.  If labs are unremarkable and symptoms are improved likely discharge.  Clinical Course as of 08/01/22 1516  Mon Aug 01, 2022  1513 Remainder of tests are reassuring including right upper quadrant ultrasound.  Her symptoms did not change much with Maalox, did improve some with Toradol and Zofran.  Ultrasound without evidence of cholecystitis or gallstones.  No ongoing abdominal tenderness on physical exam.  Given location of pain as well as reassuring workup, reassuring workup, suspect most likely cause of symptoms is gastritis.  Will start on antacid medication.  Since Mylanta did not seem to help much, will prescribe Carafate.  Advise close follow-up with primary doctor and strict ER precautions should she have any worsening symptoms as we would need to repeat her laboratory testing and possibly obtain CT scan. Will discharge patient to home. All questions answered. Patient comfortable with plan of discharge. Return precautions discussed with patient and specified on the after visit summary.  [WS]    Clinical Course User Index [WS] Lonell Grandchild, MD     Additional history obtained: -Additional history obtained from spouse -External records from outside source obtained and reviewed including: Chart review including previous notes, labs, imaging, consultation notes including prior pregnancy testing   Lab Tests: -I ordered, reviewed, and interpreted labs.   The pertinent results include:   Labs Reviewed  BASIC METABOLIC PANEL - Abnormal; Notable for the following components:      Result Value   CO2 21 (*)    Glucose, Bld 111 (*)    Calcium 8.5 (*)    All other components within normal limits  CBC - Abnormal; Notable for the following components:   RBC 5.94 (*)    Hemoglobin 11.4 (*)    MCV 62.0 (*)    MCH 19.2 (*)    RDW 20.4 (*)    All other components within normal limits  PREGNANCY, URINE  HEPATIC FUNCTION PANEL   LIPASE, BLOOD    Notable for mild anemia. Normal LFTs and normal lipase, no leukcytosis  EKG   EKG Interpretation  Date/Time:  Monday August 01 2022 10:38:35 EDT Ventricular Rate:  78 PR Interval:  147 QRS Duration: 84 QT Interval:  396 QTC Calculation: 452 R Axis:   87 Text Interpretation: Sinus rhythm Confirmed by Alvino Blood ([id number]) on 08/01/2022 10:48:21 AM         Imaging Studies ordered: I ordered imaging studies including RUQ [place] On my interpretation imaging demonstrates no acute process I independently visualized and interpreted imaging. I agree with the radiologist interpretation   Medicines ordered and prescription drug management: Meds ordered this encounter  Medications   alum & mag hydroxide-simeth (MAALOX/MYLANTA) 200-200-20 MG/5ML suspension 30 mL   ketorolac (TORADOL) 15 MG/ML injection 15 mg   ondansetron (ZOFRAN) injection 4 mg   sucralfate (CARAFATE) 1 GM/10ML suspension    Sig: Take 10 mLs (1 g total) by mouth 4 (four) times daily as needed (abdominal pain).    Dispense:  420 mL    Refill:  0   famotidine (PEPCID) 40 MG tablet  Sig: Take 1 tablet (40 mg total) by mouth at bedtime.    Dispense:  30 tablet    Refill:  0    -I have reviewed the patients home medicines and have made adjustments as needed   Social Determinants of Health:  Diagnosis or treatment significantly limited by social determinants of health: obesity   Reevaluation: After the interventions noted above, I reevaluated the patient and found that their symptoms have improved  Co morbidities that complicate the patient evaluation  Past Medical History:  Diagnosis Date   Hypertension in pregnancy       Dispostion: Disposition decision including need for hospitalization was considered, and patient discharged from emergency department.    Final Clinical Impression(s) / ED Diagnoses Final diagnoses:  Acute gastritis without hemorrhage, unspecified gastritis type      This chart was dictated using voice recognition software.  Despite best efforts to proofread,  errors can occur which can change the documentation meaning.    Lonell Grandchild, MD 08/01/22 [phone]

## 2022-08-01 NOTE — ED Notes (Signed)
Pt to XR

## 2022-08-12 ENCOUNTER — Other Ambulatory Visit (HOSPITAL_BASED_OUTPATIENT_CLINIC_OR_DEPARTMENT_OTHER): Payer: Self-pay

## 2022-08-15 ENCOUNTER — Other Ambulatory Visit (HOSPITAL_BASED_OUTPATIENT_CLINIC_OR_DEPARTMENT_OTHER): Payer: Self-pay

## 2022-08-15 ENCOUNTER — Other Ambulatory Visit (HOSPITAL_COMMUNITY): Payer: Self-pay

## 2022-08-16 ENCOUNTER — Other Ambulatory Visit (HOSPITAL_BASED_OUTPATIENT_CLINIC_OR_DEPARTMENT_OTHER): Payer: Self-pay

## 2022-08-17 ENCOUNTER — Other Ambulatory Visit: Payer: Self-pay

## 2022-10-07 ENCOUNTER — Emergency Department (HOSPITAL_BASED_OUTPATIENT_CLINIC_OR_DEPARTMENT_OTHER)
Admission: EM | Admit: 2022-10-07 | Discharge: 2022-10-07 | Disposition: A | Discharge status: Home / Self Care | Payer: Worker's Compensation | Attending: Emergency Medicine | Admitting: Emergency Medicine

## 2022-10-07 ENCOUNTER — Emergency Department (HOSPITAL_BASED_OUTPATIENT_CLINIC_OR_DEPARTMENT_OTHER): Payer: Medicaid Other

## 2022-10-07 ENCOUNTER — Other Ambulatory Visit: Payer: Self-pay

## 2022-10-07 DIAGNOSIS — Y990 Civilian activity done for income or pay: Secondary | ICD-10-CM | POA: Diagnosis not present

## 2022-10-07 DIAGNOSIS — W231XXA Caught, crushed, jammed, or pinched between stationary objects, initial encounter: Secondary | ICD-10-CM | POA: Insufficient documentation

## 2022-10-07 DIAGNOSIS — S62631B Displaced fracture of distal phalanx of left index finger, initial encounter for open fracture: Secondary | ICD-10-CM | POA: Diagnosis not present

## 2022-10-07 DIAGNOSIS — S6992XA Unspecified injury of left wrist, hand and finger(s), initial encounter: Secondary | ICD-10-CM | POA: Diagnosis present

## 2022-10-07 MED ORDER — ONDANSETRON HCL 4 MG/2ML IJ SOLN
INTRAMUSCULAR | Status: AC
  Filled 2022-10-07: qty 2

## 2022-10-07 MED ORDER — OXYCODONE-ACETAMINOPHEN 5-325 MG PO TABS: 1.0000 | ORAL_TABLET | Freq: Three times a day (TID) | ORAL | 0 refills | Status: AC | PRN

## 2022-10-07 MED ORDER — BUPIVACAINE HCL (PF) 0.5 % IJ SOLN
10.0000 mL | Freq: Once | INTRAMUSCULAR | Status: AC
  Administered 2022-10-07: 10 mL
  Filled 2022-10-07: qty 10

## 2022-10-07 MED ORDER — CEFAZOLIN SODIUM-DEXTROSE 1-4 GM/50ML-% IV SOLN
1.0000 g | Freq: Once | INTRAVENOUS | Status: AC
  Administered 2022-10-07: 1 g via INTRAVENOUS
  Filled 2022-10-07: qty 50

## 2022-10-07 MED ORDER — TETANUS-DIPHTH-ACELL PERTUSSIS 5-2.5-18.5 LF-MCG/0.5 IM SUSY
0.5000 mL | PREFILLED_SYRINGE | Freq: Once | INTRAMUSCULAR | Status: DC
  Filled 2022-10-07: qty 0.5

## 2022-10-07 MED ORDER — IBUPROFEN 800 MG PO TABS: 800.0000 mg | ORAL_TABLET | Freq: Three times a day (TID) | ORAL | 0 refills | Status: DC

## 2022-10-07 MED ORDER — OXYCODONE-ACETAMINOPHEN 5-325 MG PO TABS
1.0000 | ORAL_TABLET | Freq: Once | ORAL | Status: AC
  Administered 2022-10-07: 1 via ORAL
  Filled 2022-10-07: qty 1

## 2022-10-07 MED ORDER — ONDANSETRON HCL 4 MG/2ML IJ SOLN
4.0000 mg | Freq: Once | INTRAMUSCULAR | Status: AC
  Administered 2022-10-07: 4 mg via INTRAVENOUS

## 2022-10-07 MED ORDER — OXYCODONE-ACETAMINOPHEN 5-325 MG PO TABS: 1.0000 | ORAL_TABLET | Freq: Three times a day (TID) | ORAL | 0 refills | Status: DC | PRN

## 2022-10-07 NOTE — Discharge Instructions (Addendum)
As we discussed, your finger is broken and we have repaired your nail bed and the tip of your finger with sutures.  I have talked to our hand surgeon Dr. Frazier Butt about you and he is expecting to see you in his office first thing Monday morning.  Please call before you go to his office to ensure that you have an appointment scheduled.  In the interim, we have cleaned and dressed your wound and I recommend that you do dressing changes every 24 hours but do not get the area wet.  You also need to wear the splint at all times.  I have also given you a prescription for Percocet which is a narcotic pain medication for you to take as prescribed as needed for severe pain only.  Do not drive or operate heavy machinery while taking this medication as it can be sedating.  We have closed your laceration(s) with sutures. These need to be removed in 7-10 days. This can be done at any doctor's office, urgent care, or emergency department.   If any of the sutures come out before it is time for removal, that is okay. Make sure to keep the area as clean and dry as possible.  Watch out for signs of infection, like we discussed, including: increased redness, tenderness, or drainage of pus from the area. If this happens please seek medical attention for possible infection.   Return if development of any new or worsening symptoms.

## 2022-10-07 NOTE — ED Provider Notes (Signed)
[place] [facility] Provider Note   CSN: [id number] Arrival date & time: 10/07/22  1339     History  Chief Complaint  Patient presents with   Finger Injury    Kristy Mitchell is a 22 y.o. female.  Patient with no pertinent past medical history presents today with complaints of left index finger injury.  She states that same occurred immediately prior to arrival today when she was using an industrial grade hole punch and accidentally punched through her finger. She endorses pain to same. She is unsure of her last tetanus. She is not anticoagulated. Denies any other injuries or complaints.  The history is provided by the patient. No language interpreter was used.       Home Medications Prior to Admission medications   Medication Sig Start Date End Date Taking? Authorizing Provider  famotidine (PEPCID) 40 MG tablet Take 1 tablet (40 mg total) by mouth at bedtime. 08/01/22   Lonell Grandchild, MD  sucralfate (CARAFATE) 1 GM/10ML suspension Take 10 mLs (1 g total) by mouth 4 (four) times daily as needed (abdominal pain). 08/01/22   Lonell Grandchild, MD      Allergies    Patient has no known allergies.    Review of Systems   Review of Systems  Skin:  Positive for wound.  All other systems reviewed and are negative.   Physical Exam Updated Vital Signs BP 125/87 (BP Location: Right Arm)   Pulse 85   Temp 98.3 F (36.8 C) (Oral)   Resp 18   SpO2 100%  Physical Exam Vitals and nursing note reviewed.  Constitutional:      General: She is not in acute distress.    Appearance: Normal appearance. She is normal weight. She is not ill-appearing, toxic-appearing or diaphoretic.  HENT:     Head: Normocephalic and atraumatic.  Cardiovascular:     Rate and Rhythm: Normal rate.  Pulmonary:     Effort: Pulmonary effort is normal. No respiratory distress.  Musculoskeletal:        General: Normal range of motion.     Cervical back:  Normal range of motion.     Comments: Wound noted through the nailbed of the left index finger, through the pad of the anterior finger as well with separation of the finger tip. Good distal sensation and capillary refill. ROM intact. See images below for further  Skin:    General: Skin is warm and dry.  Neurological:     General: No focal deficit present.     Mental Status: She is alert.  Psychiatric:        Mood and Affect: Mood normal.        Behavior: Behavior normal.        ED Results / Procedures / Treatments   Labs (all labs ordered are listed, but only abnormal results are displayed) Labs Reviewed - No data to display  EKG None  Radiology DG Finger Index Left  Result Date: 10/07/2022 CLINICAL DATA:  Finger injury, laceration to distal index finger. EXAM: LEFT INDEX FINGER 2+V COMPARISON:  None Available. FINDINGS: Comminuted displaced distal tuft fracture. No intra-articular involvement. Proximal digit is intact. Skin and soft tissue irregularity about the distal digit. No radiopaque foreign body. IMPRESSION: Comminuted displaced distal tuft fracture. Given overlying laceration this may represent an open fracture. Electronically Signed   By: Narda Rutherford M.D.   On: 10/07/2022 14:26    Procedures .Nail Removal  Date/Time:  10/07/2022 6:36 PM  Performed by: Silva Bandy, PA-C Authorized by: Silva Bandy, PA-C   Consent:    Consent obtained:  Verbal   Consent given by:  Patient   Risks, benefits, and alternatives were discussed: yes     Risks discussed:  Bleeding, incomplete removal, infection, pain and permanent nail deformity   Alternatives discussed:  No treatment, delayed treatment, alternative treatment, observation and referral Universal protocol:    Procedure explained and questions answered to patient or proxy's satisfaction: yes     Imaging studies available: yes     Patient identity confirmed:  Verbally with patient Pre-procedure details:    Skin  preparation:  Chlorhexidine Procedure details:    Location:  Hand   Hand location:  L index finger Anesthesia:    Anesthesia method:  Nerve block   Block needle gauge:  25 G   Block anesthetic:  Bupivacaine 0.5% w/o epi   Block technique:  Digital block   Block injection procedure:  Anatomic landmarks identified, introduced needle, incremental injection, negative aspiration for blood and anatomic landmarks palpated   Block outcome:  Anesthesia achieved Nail Removal:    Nail removed:  Partial   Nail side:  Radial   Nail bed repaired: yes     Nail bed suture material: 5-0 vicryl rapid.   Number of sutures:  8   Removed nail replaced and anchored: yes (Removed nail was damaged, replaced with sterile aluminum material from suture packaging)     Stented with:  5-0 prolene Post-procedure details:    Dressing:  Splint, antibiotic ointment and non-adhesive packing strip   Procedure completion:  Tolerated well, no immediate complications .Marland KitchenLaceration Repair  Date/Time: 10/07/2022 6:39 PM  Performed by: Silva Bandy, PA-C Authorized by: Silva Bandy, PA-C   Consent:    Consent obtained:  Verbal   Consent given by:  Patient   Risks, benefits, and alternatives were discussed: yes     Risks discussed:  Infection, need for additional repair, nerve damage, poor wound healing, poor cosmetic result, pain, retained foreign body, tendon damage and vascular damage   Alternatives discussed:  No treatment, delayed treatment, observation and referral Universal protocol:    Procedure explained and questions answered to patient or proxy's satisfaction: yes     Patient identity confirmed:  Verbally with patient Anesthesia:    Anesthesia method:  Nerve block   Block location:  Base of left index finger   Block needle gauge:  25 G   Block anesthetic:  Bupivacaine 0.5% w/o epi   Block technique:  Digital block   Block injection procedure:  Anatomic landmarks identified, anatomic landmarks palpated,  negative aspiration for blood, introduced needle and incremental injection   Block outcome:  Anesthesia achieved Laceration details:    Location:  Finger   Finger location:  L index finger   Length (cm):  4   Depth (mm):  5 Exploration:    Hemostasis achieved with:  Direct pressure   Imaging obtained: x-ray     Imaging outcome: foreign body not noted     Wound exploration: wound explored through full range of motion and entire depth of wound visualized   Treatment:    Area cleansed with:  Soap and water   Amount of cleaning:  Standard   Irrigation solution:  Sterile saline   Irrigation volume:  1 L   Irrigation method:  Pressure wash Skin repair:    Repair method:  Sutures   Suture size:  5-0  Suture material:  Prolene   Number of sutures:  5 Approximation:    Approximation:  Close Repair type:    Repair type:  Simple Post-procedure details:    Dressing:  Antibiotic ointment and non-adherent dressing   Procedure completion:  Tolerated well, no immediate complications     Medications Ordered in ED Medications  Tdap (BOOSTRIX) injection 0.5 mL (0.5 mLs Intramuscular Patient Refused/Not Given 10/07/22 1406)  bupivacaine(PF) (MARCAINE) 0.5 % injection 10 mL (10 mLs Infiltration Given by Other 10/07/22 1405)  oxyCODONE-acetaminophen (PERCOCET/ROXICET) 5-325 MG per tablet 1 tablet (1 tablet Oral Given 10/07/22 1405)  ceFAZolin (ANCEF) IVPB 1 g/50 mL premix (0 g Intravenous Stopped 10/07/22 1647)  ondansetron (ZOFRAN) injection 4 mg (0 mg Intravenous Hold 10/07/22 1630)    ED Course/ Medical Decision Making/ A&P                                 Medical Decision Making Amount and/or Complexity of Data Reviewed Radiology: ordered.  Risk Prescription drug management.   Patient presents today with complaints of left index finger injury which occurred immediately prior to arrival today.  She is afebrile, nontoxic-appearing, and in no acute distress with reassuring vital signs.   Physical exam reveals images per above.  Wound traverses through the nailbed and into the anterior pad of the left index finger.  I am able to separate the medial and lateral notes of the fingertip.  Distal sensation and capillary refill intact.  X-ray imaging obtained which has resulted and reveals  Comminuted displaced distal tuft fracture. Given overlying laceration this may represent an open fracture.  I have personally reviewed and interpreted this imaging and agree with radiology interpretation.  Given concern for open fracture, discussed case with hand surgery on-call Dr. Frazier Butt who reviewed the images and recommended removal of the remaining nail and nailbed repair.  He plans to see the patient first thing Monday morning in the office.  He also recommends a dose of IV Ancef.  Same has been ordered and administered.  Pressure irrigation performed of patient's wound per above procedure which was well-tolerated.  Patient's nail was removed and replaced with a sterile piece of aluminum from the suture packaging and sutured into place in the nailbed after repair was completed.  Wound explored and base of wound visualized in a bloodless field without evidence of foreign body.  Laceration occurred < 8 hours prior to repair which was well tolerated. Tdap updated.  Discussed suture home care with patient and answered questions.  Given the distal tuft fracture, I have placed her in a splint for support and told her to wear this at all times until she can follow-up with hand surgery.  I have given her recommendations with a referral to Dr. Percell Locus office for follow-up.  Pt to follow-up for wound check and suture removal in 7-10 days as well; they are to return to the ED sooner for signs of infection.  Given prescription for Percocet for pain given the extent of the injury.  PDMP reviewed, patient advised not to drive or operate heavy machinery while taking this medication.  Evaluation and diagnostic  testing in the emergency department does not suggest an emergent condition requiring admission or immediate intervention beyond what has been performed at this time.  Plan for discharge with close PCP follow-up.  Patient is understanding and amenable with plan, educated on red flag symptoms that would prompt immediate return.  Patient discharged in stable condition.  Findings and plan of care discussed with supervising physician Dr. Lockie Mola who is in agreement.   Final Clinical Impression(s) / ED Diagnoses Final diagnoses:  Open displaced fracture of distal phalanx of left index finger, initial encounter    Rx / DC Orders ED Discharge Orders          Ordered    oxyCODONE-acetaminophen (PERCOCET/ROXICET) 5-325 MG tablet  Every 8 hours PRN,   Status:  Discontinued        10/07/22 1820    ibuprofen (ADVIL) 800 MG tablet  3 times daily,   Status:  Discontinued        10/07/22 1820    ibuprofen (ADVIL) 800 MG tablet  3 times daily        10/07/22 1831    oxyCODONE-acetaminophen (PERCOCET/ROXICET) 5-325 MG tablet  Every 8 hours PRN        10/07/22 1831          An After Visit Summary was printed and given to the patient.     Vear Clock 10/07/22 1906    Virgina Norfolk, DO 10/08/22 1017

## 2022-10-07 NOTE — ED Triage Notes (Signed)
Patient presents to ED via POV from work. Patient injured her left pointer finger today. No active bleeding. Dressing in place. Positive PMS.

## 2022-10-11 IMAGING — CT CT ABD-PELV W/ CM
2 of 4 series · 15 of 46 positions shown, 17 images · IV contrast (agent unspecified)
Comparison: None.

CLINICAL DATA: Left lower quadrant pain for several days, initial
encounter

EXAM:
CT ABDOMEN AND PELVIS WITH CONTRAST
TECHNIQUE: Multidetector CT imaging of the abdomen and pelvis was performed
using the standard protocol following bolus administration of
intravenous contrast.

[Series 3: axial st · axial · 0.97mm/px · z∈[+565,+960]mm · 12 of 93 slices shown, 14 images]
[im 8/93  soft-tissue]
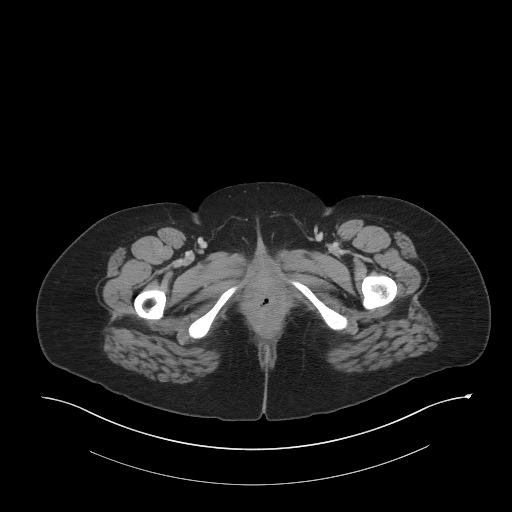
[im 8/93  bone]
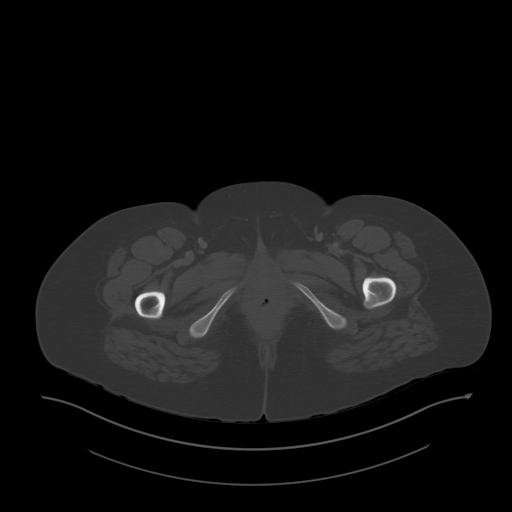
[im 15/93  soft-tissue]
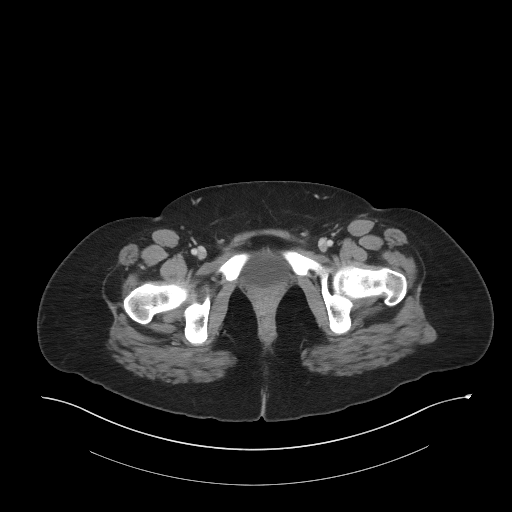
[im 22/93  soft-tissue]
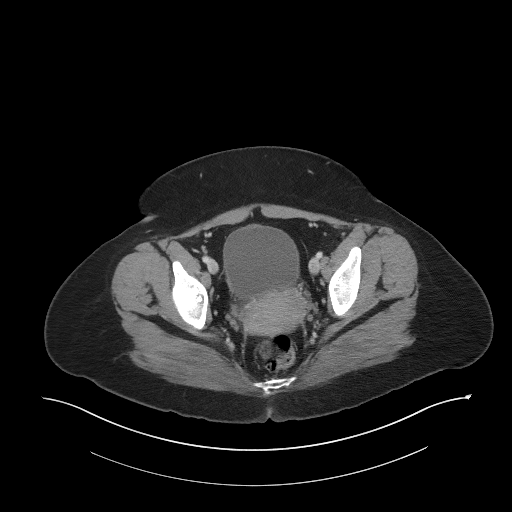
[im 29/93  soft-tissue]
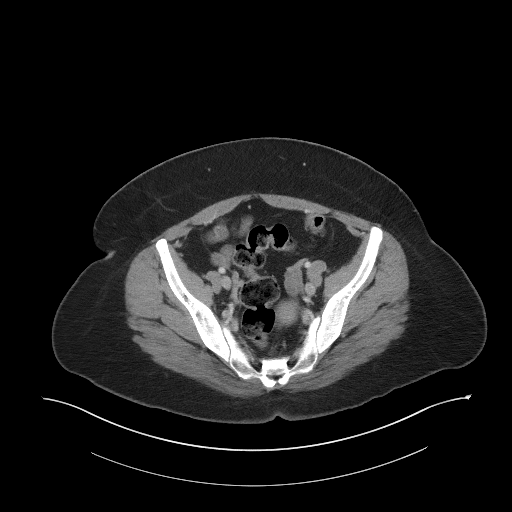
[im 36/93  soft-tissue]
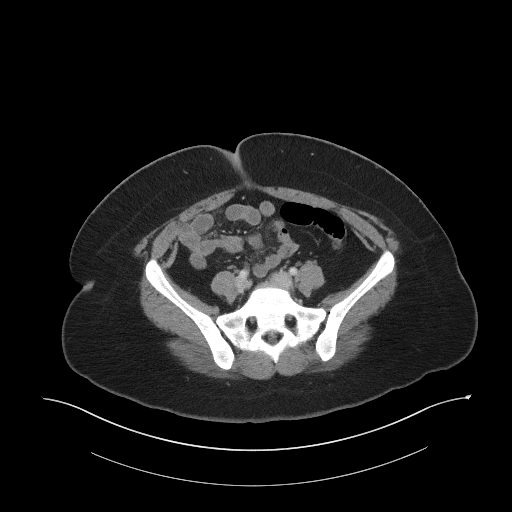
[im 43/93  soft-tissue]
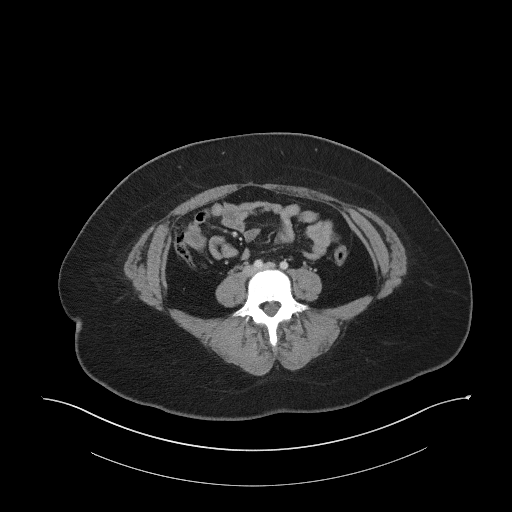
[im 50/93  soft-tissue]
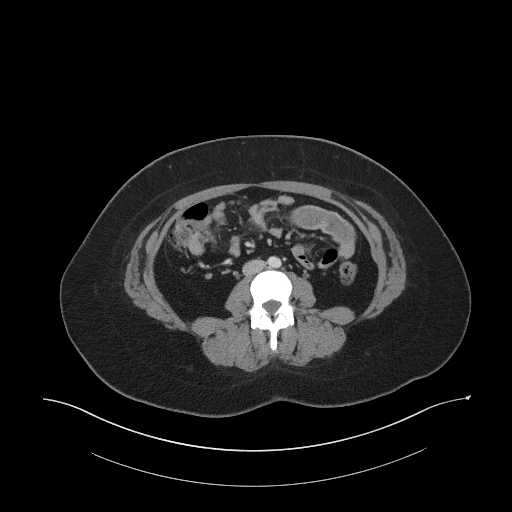
[im 57/93  soft-tissue]
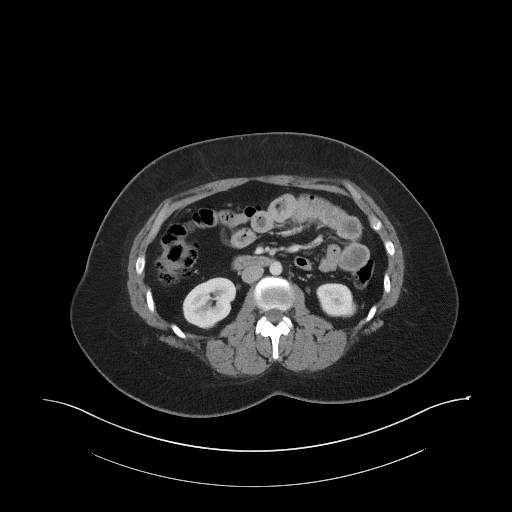
[im 64/93  soft-tissue]
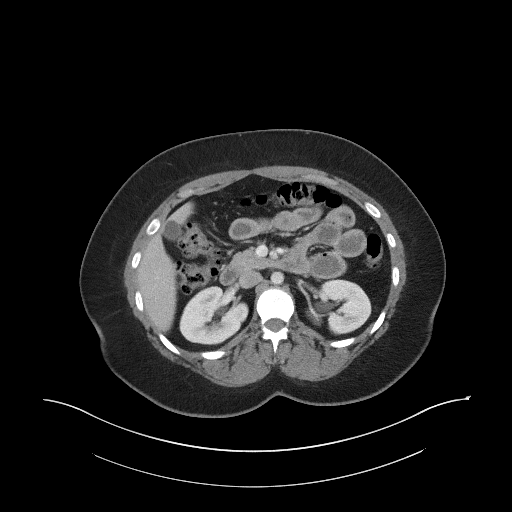
[im 64/93  bone]
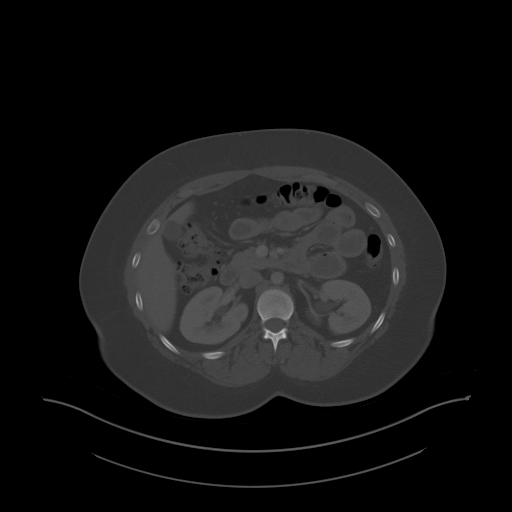
[im 71/93  soft-tissue]
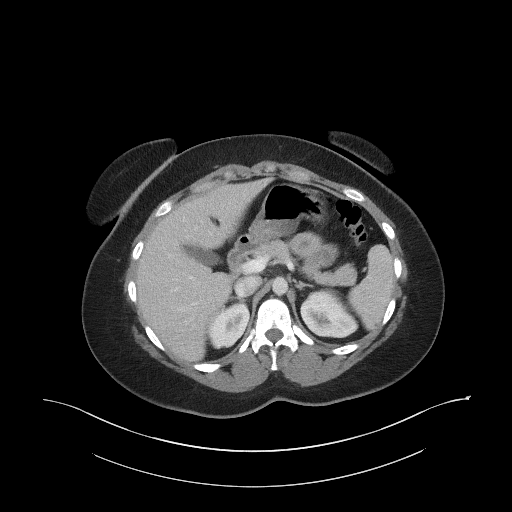
[im 78/93  soft-tissue]
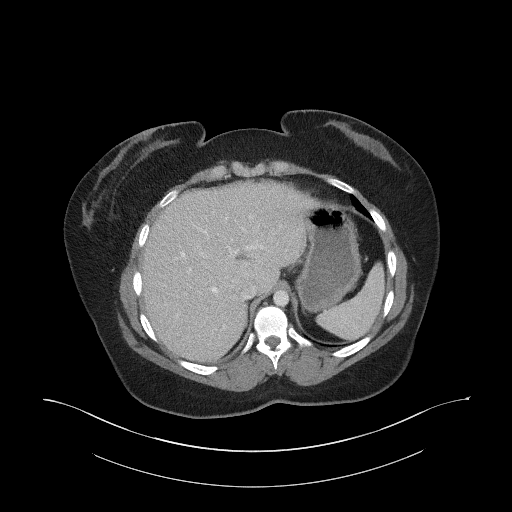
[im 85/93  soft-tissue]
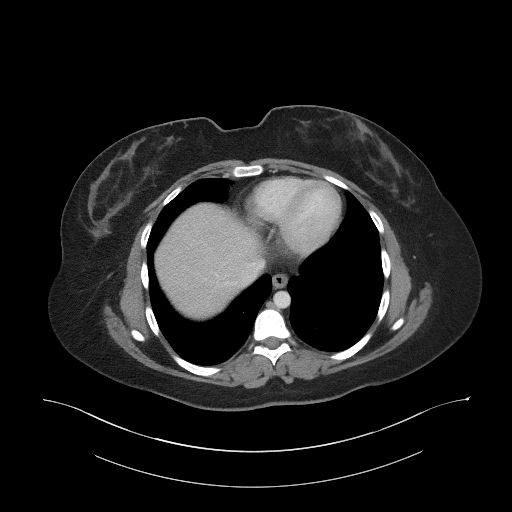

[Series 6: coronal st · coronal · 0.70mm/px · 3 of 86 slices shown]
[im 29/86  soft-tissue]
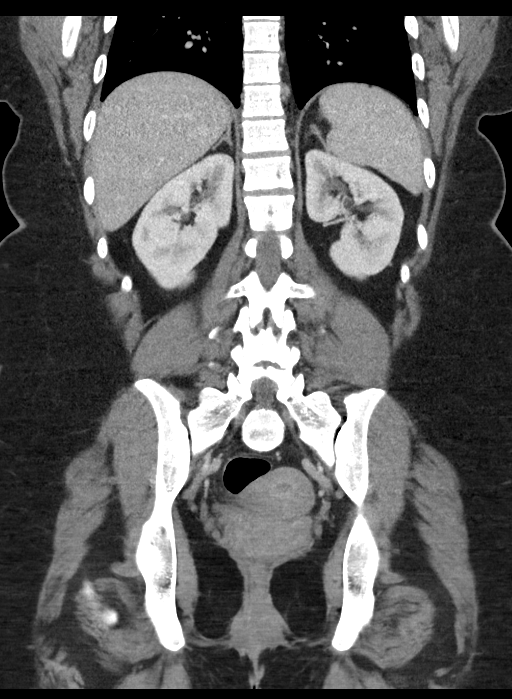
[im 38/86  soft-tissue]
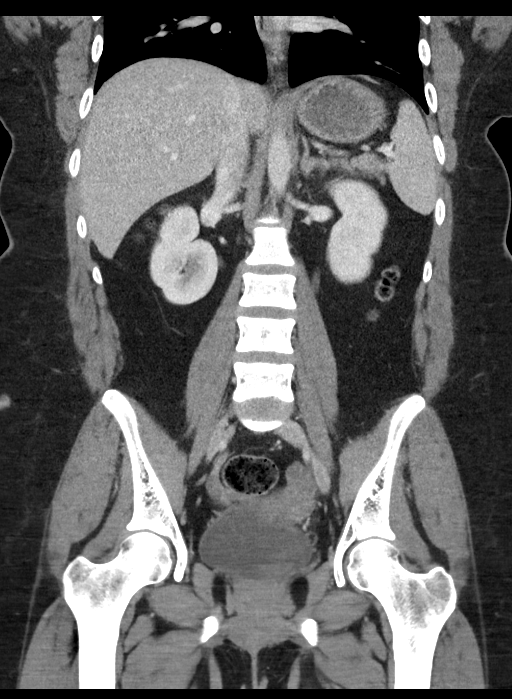
[im 48/86  soft-tissue]
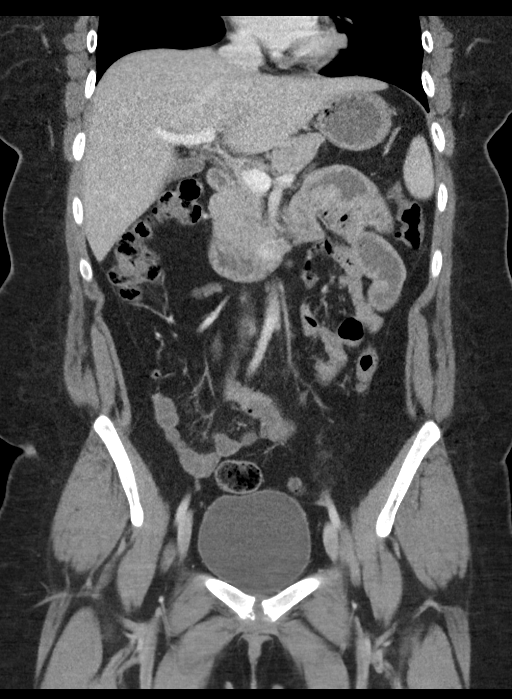

[15 of 46 positions shown; findings below may reference images not displayed]

RADIATION DOSE REDUCTION: This exam was performed according to the
departmental dose-optimization program which includes automated
exposure control, adjustment of the mA and/or kV according to
patient size and/or use of iterative reconstruction technique.

CONTRAST:  100mL OMNIPAQUE IOHEXOL 300 MG/ML  SOLN
FINDINGS: Lower chest: No acute abnormality.

Hepatobiliary: Mild fatty infiltration of the liver is noted. The
gallbladder is within normal limits.

Pancreas: Unremarkable. No pancreatic ductal dilatation or
surrounding inflammatory changes.

Spleen: Normal in size without focal abnormality.

Adrenals/Urinary Tract: The adrenal glands are unremarkable. Kidneys
demonstrate a normal enhancement pattern bilaterally. No renal
calculi or obstructive changes are seen. Ureters are within normal
limits. The bladder is well distended.

Stomach/Bowel: The appendix is well visualized and within normal
limits. No obstructive or inflammatory changes of the colon are
seen. Stomach is within normal limits. Small bowel demonstrates no
obstructive changes although some very mild wall thickening is noted
in the left mid abdomen in the jejunum which may represent a degree
of enteritis. The more distal small bowel is within normal limits.

Vascular/Lymphatic: No significant vascular findings are present. No
enlarged abdominal or pelvic lymph nodes.

Reproductive: Uterus and bilateral adnexa are unremarkable.

Other: No abdominal wall hernia or abnormality. No abdominopelvic
ascites.

Musculoskeletal: No acute or significant osseous findings.
IMPRESSION: Mild wall thickening within the jejunum which may represent some
mild enteritis.

Fatty infiltration of the liver.

No other focal abnormality is noted.

## 2022-10-11 IMAGING — US US PELVIS COMPLETE TRANSABD/TRANSVAG W DUPLEX AND/OR DOPPLER
1 series · 13 of 25 positions shown · non-contrast
Comparison: None available.

CLINICAL DATA: Initial evaluation for acute left lower quadrant
pain.

EXAM:
TRANSABDOMINAL AND TRANSVAGINAL ULTRASOUND OF PELVIS
DOPPLER ULTRASOUND OF OVARIES
TECHNIQUE: Both transabdominal and transvaginal ultrasound examinations of the
pelvis were performed. Transabdominal technique was performed for
global imaging of the pelvis including uterus, ovaries, adnexal
regions, and pelvic cul-de-sac.
It was necessary to proceed with endovaginal exam following the
transabdominal exam to visualize the endometrium and ovaries. Color
and duplex Doppler ultrasound was utilized to evaluate blood flow to
the ovaries.

[Series 1: us pelvis complete transabd/transvag w duplex and/ · 13 of 93 slices shown]
[im 1/93]
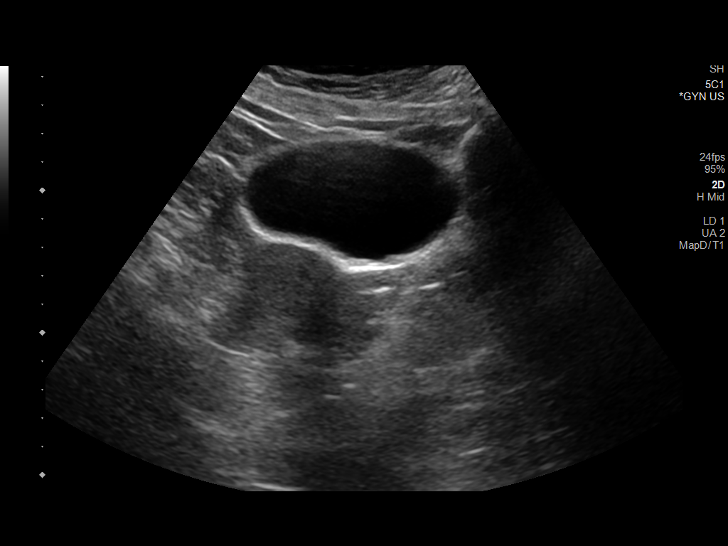
[im 8/93]
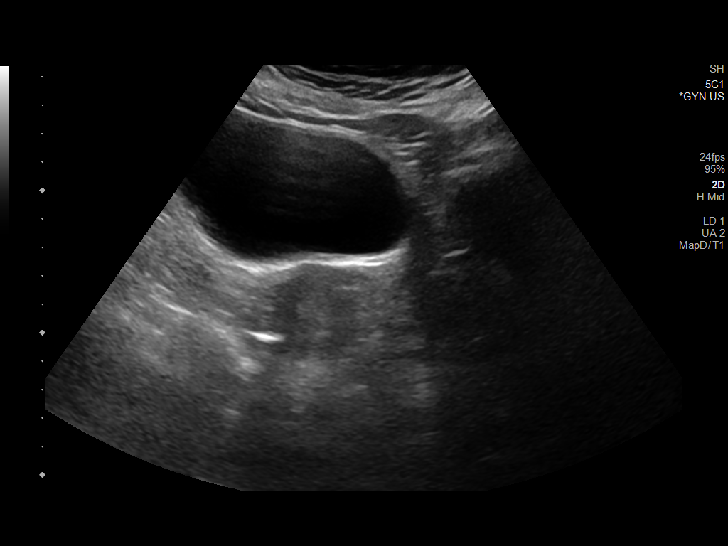
[im 16/93]
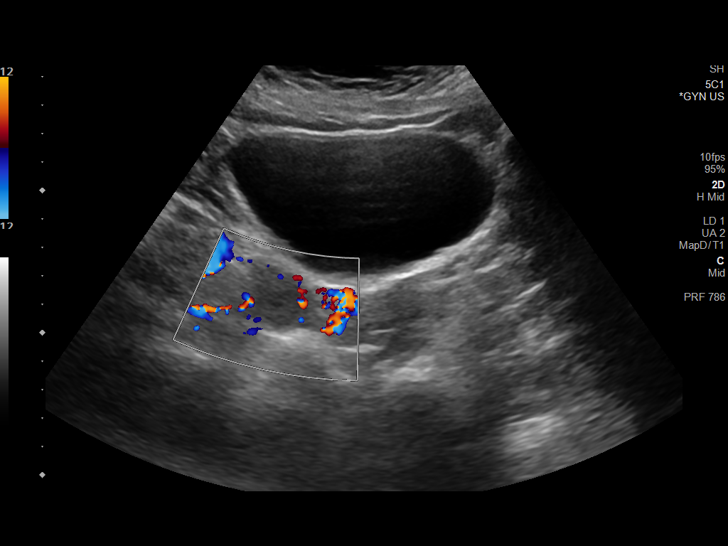
[im 24/93]
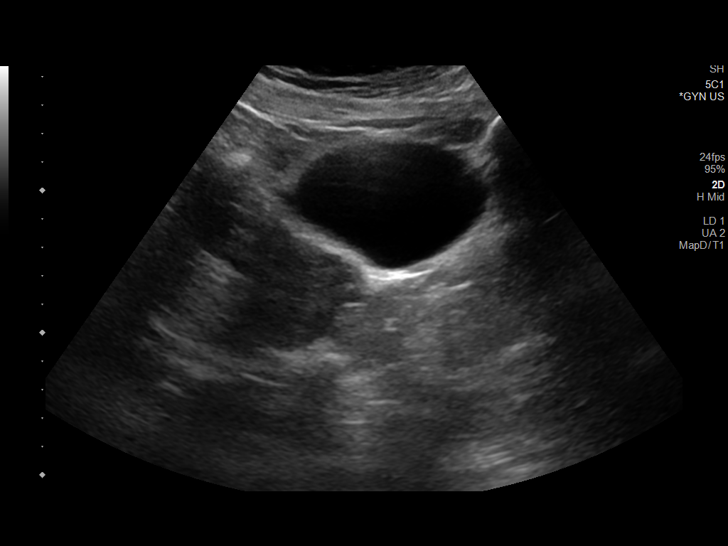
[im 31/93]
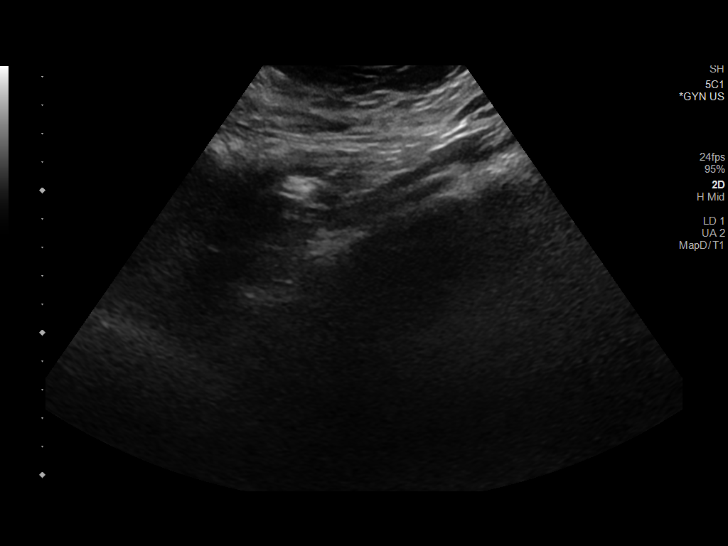
[im 39/93]
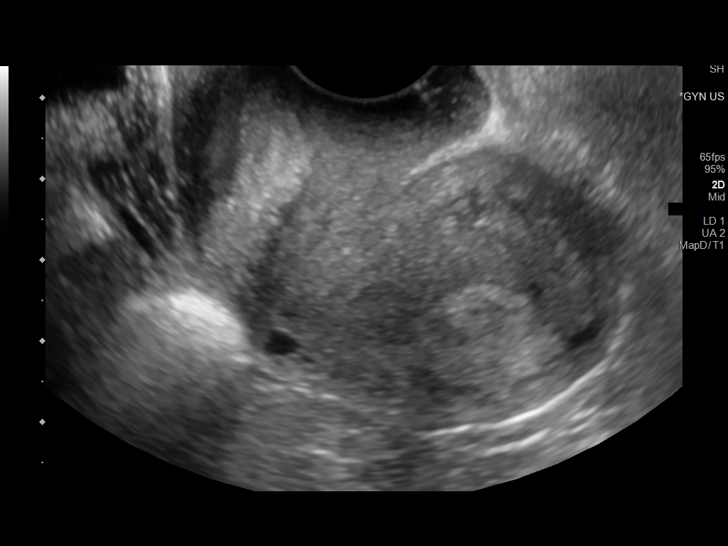
[im 47/93]
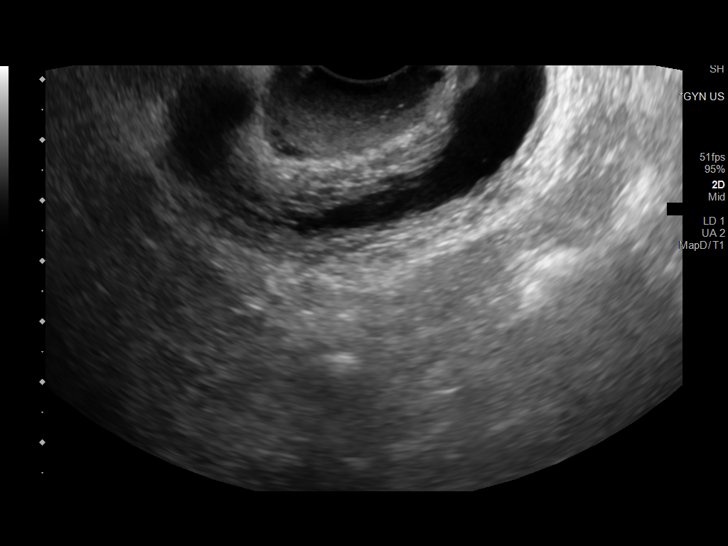
[im 54/93]
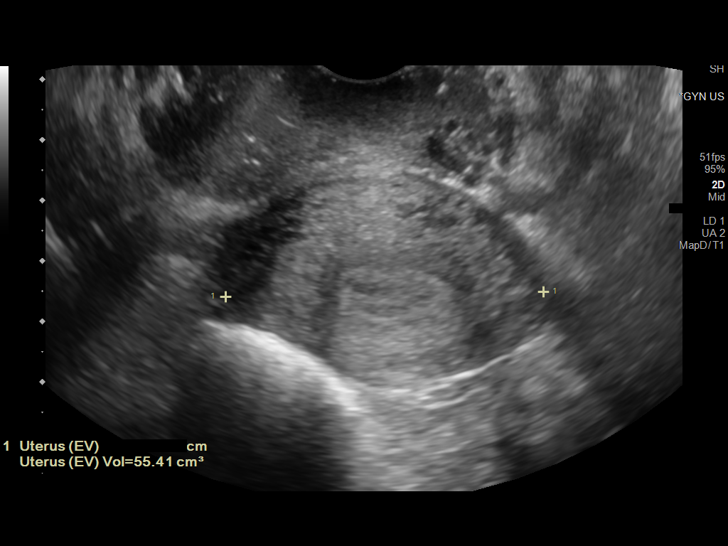
[im 62/93]
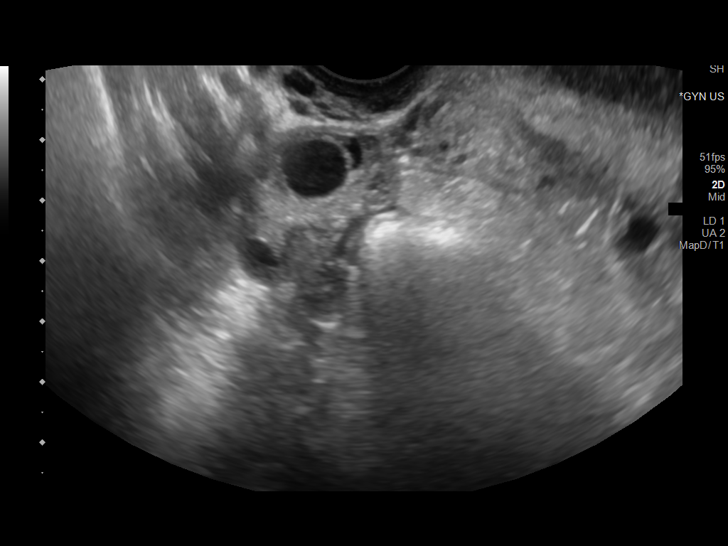
[im 70/93]
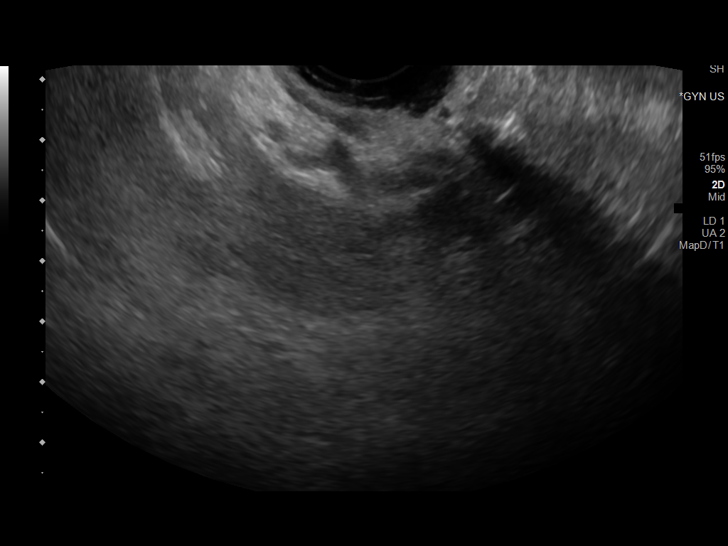
[im 77/93]
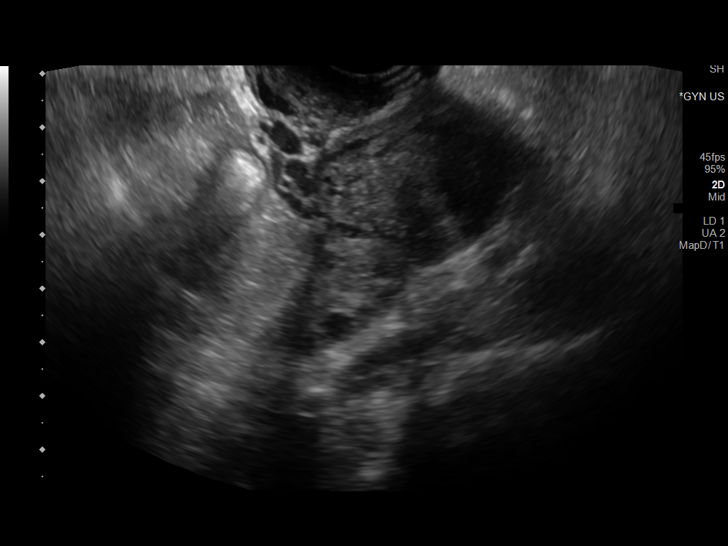
[im 85/93]
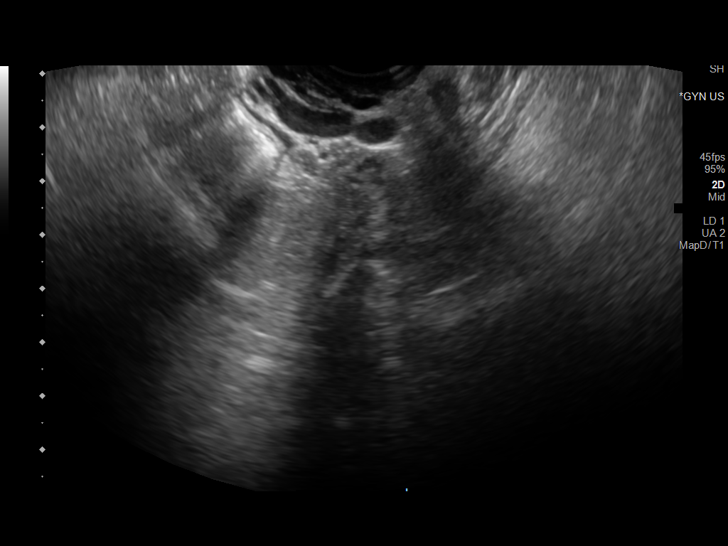
[im 93/93]
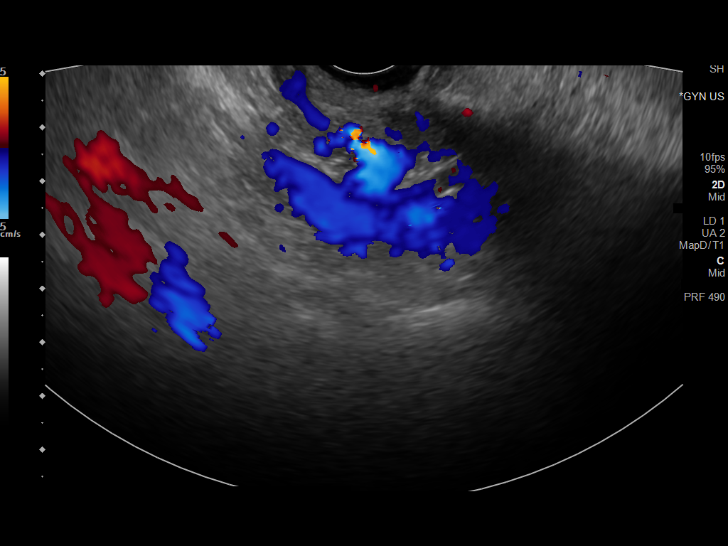

[13 of 25 positions shown; findings below may reference images not displayed]

FINDINGS: Uterus

Measurements: 5.9 x 3.4 x 5.3 cm = volume: 55.0 mL. Uterus is
retroverted. No discrete fibroid or other myometrial abnormality.

Endometrium

Thickness: 6.7 mm.  No focal abnormality visualized.

Right ovary

Measurements: 3.4 x 2.0 x 1.1 cm = volume: 3.9 mL. Normal
appearance/no adnexal mass.

Left ovary

Measurements: 3.2 x 1.7 x 2.1 cm = volume: 6.0 mL. Normal
appearance/no adnexal mass.

Pulsed Doppler evaluation of both ovaries demonstrates normal
low-resistance arterial and venous waveforms.

Other findings

No abnormal free fluid.
IMPRESSION: Normal pelvic ultrasound. No evidence for torsion or other acute
abnormality.

## 2022-10-11 IMAGING — US US RENAL
1 series · 14 of 25 positions shown · non-contrast
Comparison: None.

CLINICAL DATA: Left flank pain.

EXAM:
RENAL / URINARY TRACT ULTRASOUND COMPLETE

[Series 1: us renal · 14 of 53 slices shown]
[im 1/53]
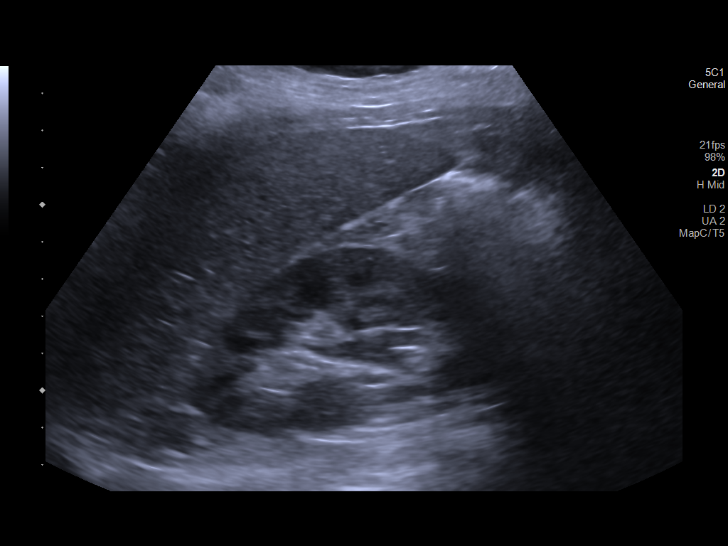
[im 5/53]
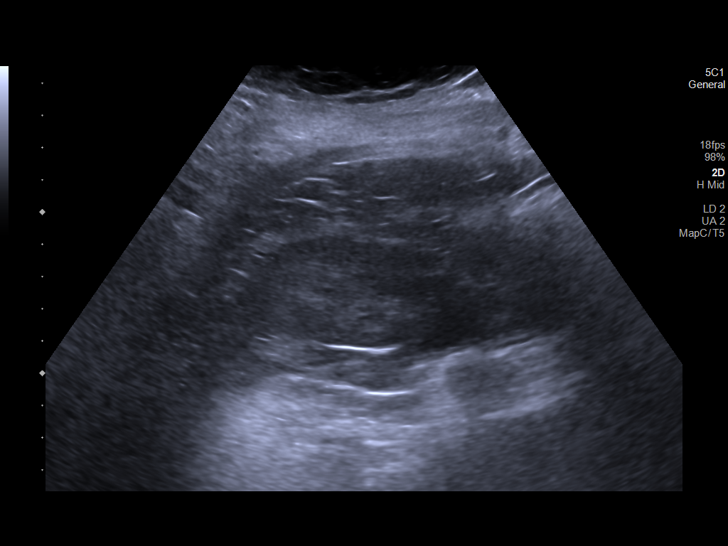
[im 9/53]
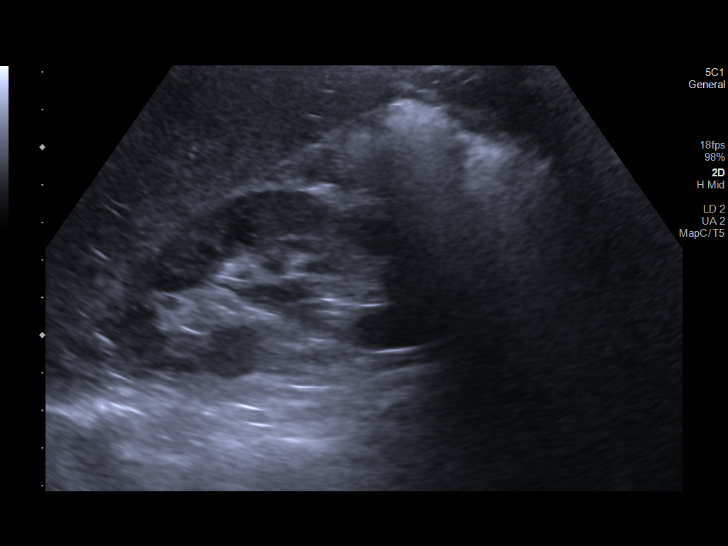
[im 14/53]
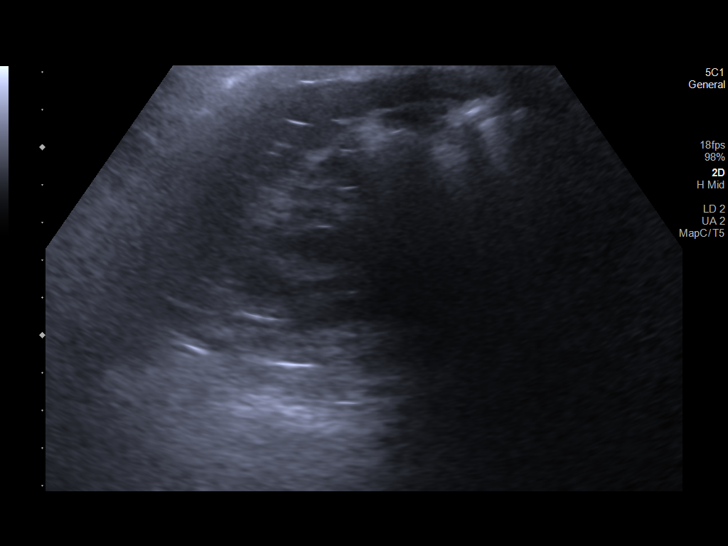
[im 18/53]
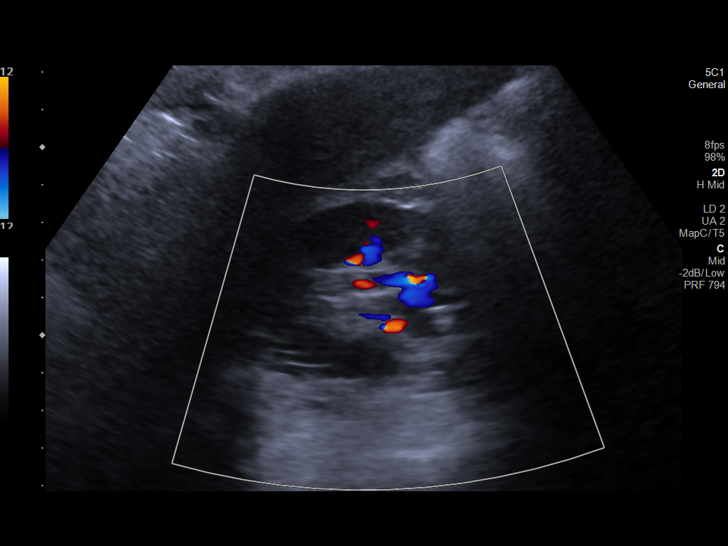
[im 20/53]
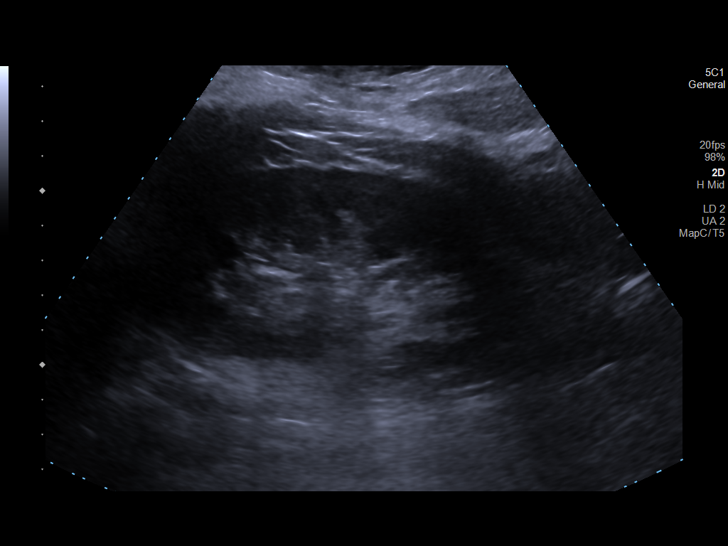
[im 24/53]
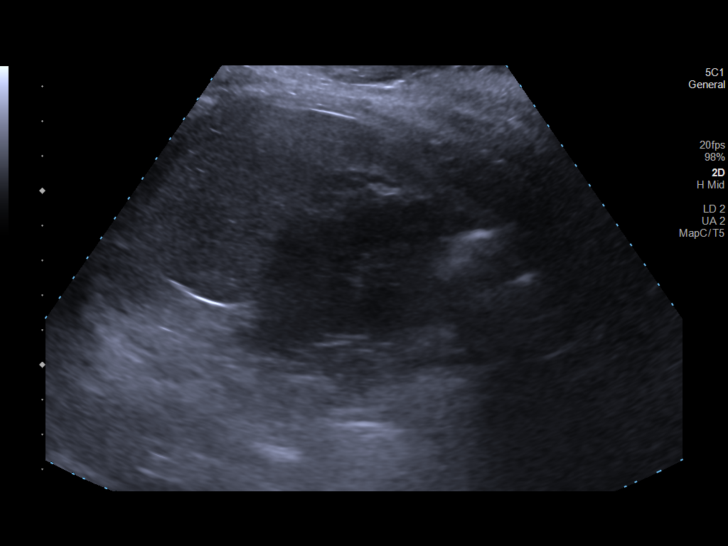
[im 29/53]
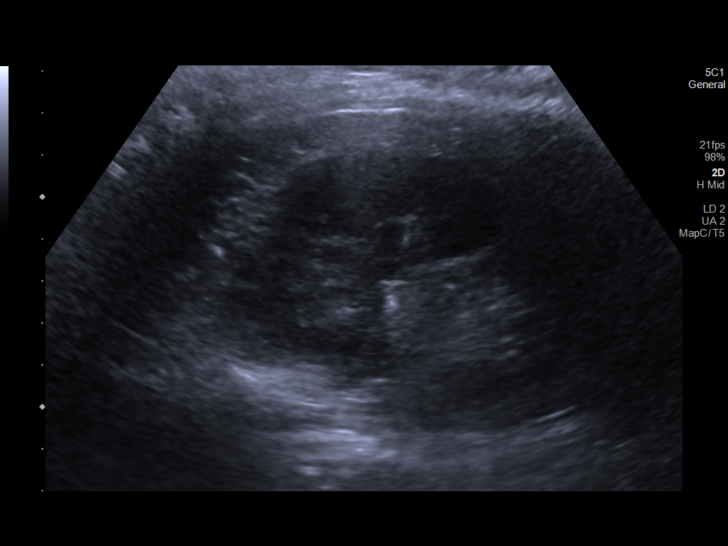
[im 33/53]
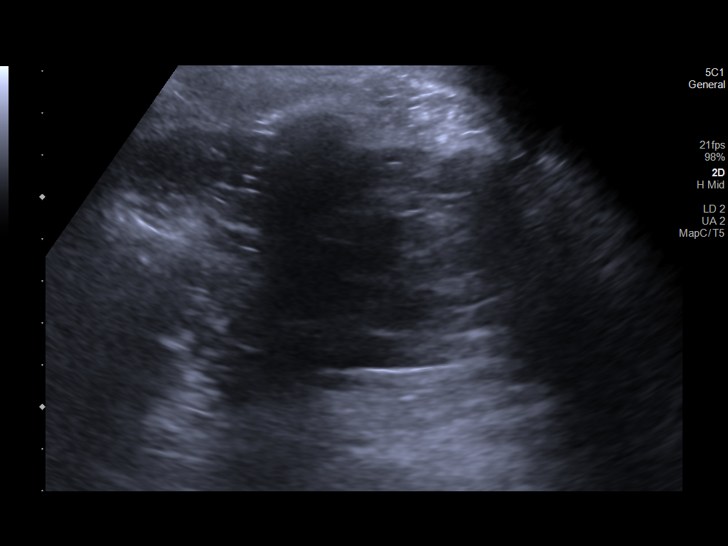
[im 35/53]
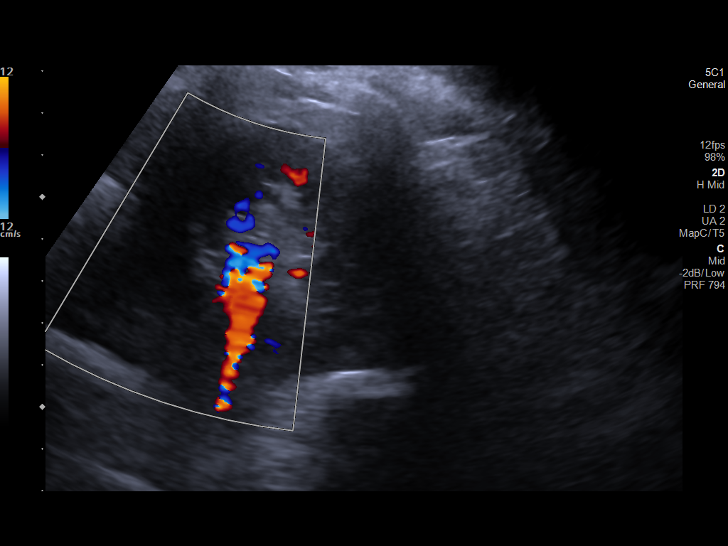
[im 40/53]
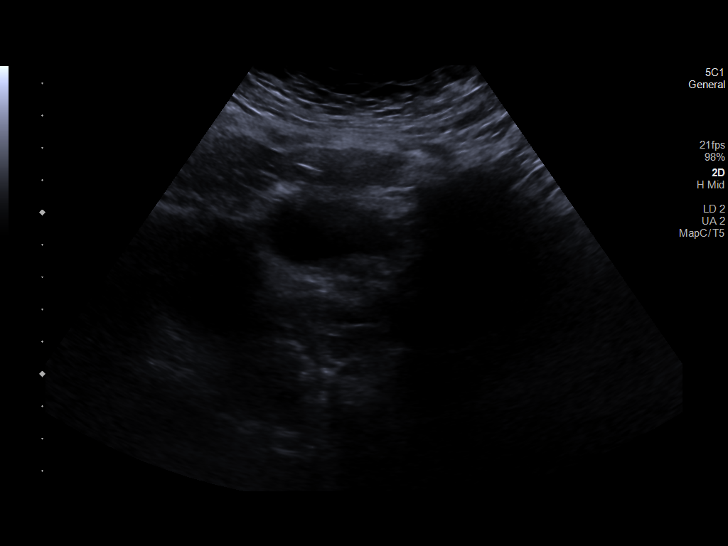
[im 44/53]
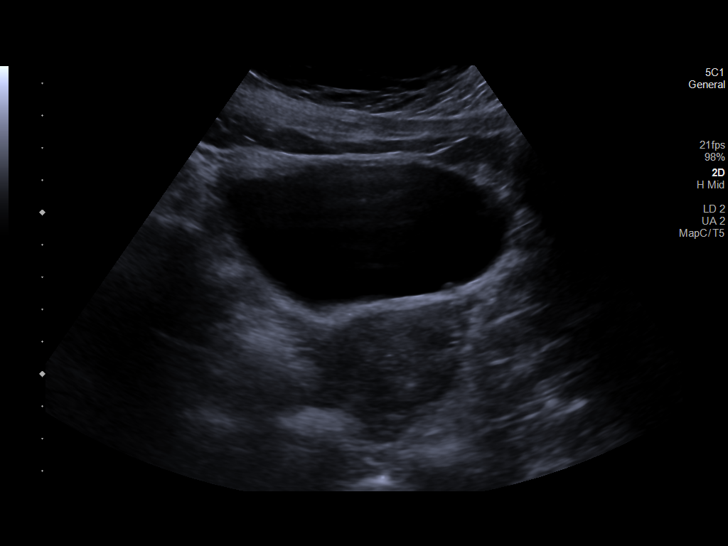
[im 48/53]
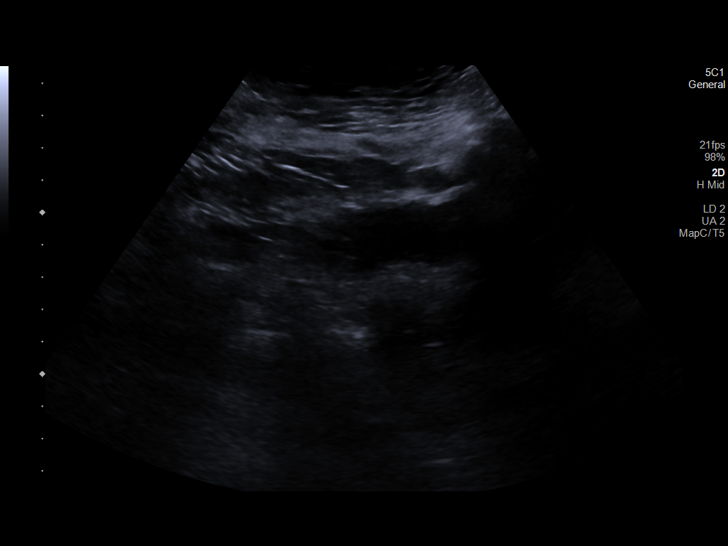
[im 53/53]
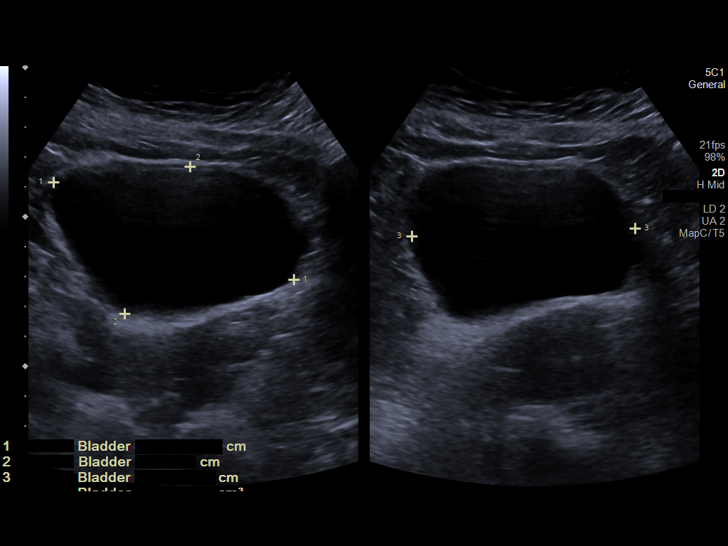

[14 of 25 positions shown; findings below may reference images not displayed]

FINDINGS: Right Kidney:

Renal measurements: 10.1 x 4.9 x 4.6 cm = volume: 11.9 mL.
Echogenicity within normal limits. No mass or hydronephrosis
visualized.

Left Kidney:

Renal measurements: 10.4 x 4.9 x 4.8 cm = volume: 129 mL.
Echogenicity within normal limits. No mass or hydronephrosis
visualized.

Bladder:

Appears normal for degree of bladder distention.

Other:

None.
IMPRESSION: Normal renal ultrasound.

## 2022-10-17 ENCOUNTER — Other Ambulatory Visit: Payer: Self-pay

## 2022-10-17 ENCOUNTER — Encounter (HOSPITAL_BASED_OUTPATIENT_CLINIC_OR_DEPARTMENT_OTHER): Payer: Self-pay | Admitting: Emergency Medicine

## 2022-10-17 DIAGNOSIS — Z4802 Encounter for removal of sutures: Secondary | ICD-10-CM | POA: Insufficient documentation

## 2022-10-17 DIAGNOSIS — S61211D Laceration without foreign body of left index finger without damage to nail, subsequent encounter: Secondary | ICD-10-CM | POA: Insufficient documentation

## 2022-10-17 DIAGNOSIS — X58XXXD Exposure to other specified factors, subsequent encounter: Secondary | ICD-10-CM | POA: Insufficient documentation

## 2022-10-17 NOTE — ED Triage Notes (Signed)
Pt reports that she injured her fingers 2 weeks ago. Pt states that she needs her stitches taken out and is having pain in her finger.

## 2022-10-18 ENCOUNTER — Emergency Department (HOSPITAL_BASED_OUTPATIENT_CLINIC_OR_DEPARTMENT_OTHER)
Admission: EM | Admit: 2022-10-18 | Discharge: 2022-10-18 | Disposition: A | Discharge status: Home / Self Care | Payer: Medicaid Other | Attending: Emergency Medicine | Admitting: Emergency Medicine

## 2022-10-18 DIAGNOSIS — Z4802 Encounter for removal of sutures: Secondary | ICD-10-CM

## 2022-10-18 NOTE — Discharge Instructions (Signed)
Please let the hand surgeon know that you came to the emergency department to have some sutures removed.  See when he wants to see you back in the office.  Take 4 over the counter ibuprofen tablets 3 times a day or 2 over-the-counter naproxen tablets twice a day for pain. Also take tylenol 1000mg (2 extra strength) four times a day.

## 2022-10-18 NOTE — ED Provider Notes (Signed)
[place] [facility] Provider Note   CSN: [id number] Arrival date & time: 10/17/22  2157     History  Chief Complaint  Patient presents with   Wound Check    Kristy Mitchell is a 22 y.o. female.  22 yo F with a chief complaints of wanting stitches removed from her finger.  Patient was seen in the emergency department after having a compressive injury to her finger.  The wound was repaired and she had follow-up with hand in the office.  She told me she was not told what would happen with the sutures and was having some worsening pain to the finger.  She called the emergency department and where she talked to encouraged her to have stitches removed and so she is here to have them done   Wound Check       Home Medications Prior to Admission medications   Medication Sig Start Date End Date Taking? Authorizing Provider  famotidine (PEPCID) 40 MG tablet Take 1 tablet (40 mg total) by mouth at bedtime. 08/01/22   Lonell Grandchild, MD  ibuprofen (ADVIL) 800 MG tablet Take 1 tablet (800 mg total) by mouth 3 (three) times daily. 10/07/22   Smoot, Shawn Route, PA-C  oxyCODONE-acetaminophen (PERCOCET/ROXICET) 5-325 MG tablet Take 1 tablet by mouth every 8 (eight) hours as needed for severe pain. 10/07/22   Smoot, Shawn Route, PA-C  sucralfate (CARAFATE) 1 GM/10ML suspension Take 10 mLs (1 g total) by mouth 4 (four) times daily as needed (abdominal pain). 08/01/22   Lonell Grandchild, MD      Allergies    Patient has no known allergies.    Review of Systems   Review of Systems  Physical Exam Updated Vital Signs BP 109/60 (BP Location: Left Arm)   Pulse 92   Temp 98.3 F (36.8 C) (Oral)   Resp 18   Ht 5\' 1"  (1.549 m)   Wt 83.9 kg   SpO2 99%   BMI 34.96 kg/m  Physical Exam Vitals and nursing note reviewed.  Constitutional:      General: She is not in acute distress.    Appearance: She is well-developed. She is not diaphoretic.  HENT:      Head: Normocephalic and atraumatic.  Eyes:     Pupils: Pupils are equal, round, and reactive to light.  Cardiovascular:     Rate and Rhythm: Normal rate and regular rhythm.     Heart sounds: No murmur heard.    No friction rub. No gallop.  Pulmonary:     Effort: Pulmonary effort is normal.     Breath sounds: No wheezing or rales.  Abdominal:     General: There is no distension.     Palpations: Abdomen is soft.     Tenderness: There is no abdominal tenderness.  Musculoskeletal:        General: No tenderness.     Cervical back: Normal range of motion and neck supple.     Comments: Left index finger with some granulation tissue, Prolene stitches to the palmar aspects and a nail guide along the dorsal aspects.  No purulent drainage.  No extending erythema no warmth  Skin:    General: Skin is warm and dry.  Neurological:     Mental Status: She is alert and oriented to person, place, and time.  Psychiatric:        Behavior: Behavior normal.     ED Results / Procedures / Treatments  Labs (all labs ordered are listed, but only abnormal results are displayed) Labs Reviewed - No data to display  EKG None  Radiology No results found.  Procedures .Suture Removal  Date/Time: 10/18/2022 2:57 AM  Performed by: Melene Plan, DO Authorized by: Melene Plan, DO   Consent:    Consent obtained:  Verbal   Consent given by:  Patient   Risks, benefits, and alternatives were discussed: yes     Risks discussed:  Bleeding, pain and wound separation   Alternatives discussed:  No treatment, delayed treatment, alternative treatment and referral Universal protocol:    Patient identity confirmed:  Verbally with patient Location:    Location:  Upper extremity   Upper extremity location:  Hand   Hand location:  L index finger Procedure details:    Wound appearance:  No signs of infection   Number of sutures removed:  4 Post-procedure details:    Post-removal:  Antibiotic ointment applied    Procedure completion:  Tolerated well, no immediate complications     Medications Ordered in ED Medications - No data to display  ED Course/ Medical Decision Making/ A&P                                 Medical Decision Making  22 yo F with a chief complaints of wanting her sutures removed to the wound to her left second digit.  She seemed confused about having the hand surgeon remove them in the office.  I remove the ones on the palmar aspect, not sure how long he would want the nail guide in place.  Will have her follow-up with him in the office.  2:59 AM:  I have discussed the diagnosis/risks/treatment options with the patient and family.  Evaluation and diagnostic testing in the emergency department does not suggest an emergent condition requiring admission or immediate intervention beyond what has been performed at this time.  They will follow up with Hand. We also discussed returning to the ED immediately if new or worsening sx occur. We discussed the sx which are most concerning (e.g., sudden worsening pain, fever, inability to tolerate by mouth) that necessitate immediate return. Medications administered to the patient during their visit and any new prescriptions provided to the patient are listed below.  Medications given during this visit Medications - No data to display   The patient appears reasonably screen and/or stabilized for discharge and I doubt any other medical condition or other [facility] requiring further screening, evaluation, or treatment in the ED at this time prior to discharge.          Final Clinical Impression(s) / ED Diagnoses Final diagnoses:  Visit for suture removal    Rx / DC Orders ED Discharge Orders     None         Melene Plan, DO 10/18/22 5188

## 2022-12-06 ENCOUNTER — Other Ambulatory Visit: Payer: Self-pay

## 2022-12-06 ENCOUNTER — Emergency Department (HOSPITAL_BASED_OUTPATIENT_CLINIC_OR_DEPARTMENT_OTHER): Payer: Medicaid Other

## 2022-12-06 ENCOUNTER — Emergency Department (HOSPITAL_BASED_OUTPATIENT_CLINIC_OR_DEPARTMENT_OTHER)
Admission: EM | Admit: 2022-12-06 | Discharge: 2022-12-06 | Disposition: A | Discharge status: Home / Self Care | Payer: Medicaid Other | Attending: Emergency Medicine | Admitting: Emergency Medicine

## 2022-12-06 ENCOUNTER — Encounter (HOSPITAL_BASED_OUTPATIENT_CLINIC_OR_DEPARTMENT_OTHER): Payer: Self-pay | Admitting: Pediatrics

## 2022-12-06 ENCOUNTER — Other Ambulatory Visit (HOSPITAL_BASED_OUTPATIENT_CLINIC_OR_DEPARTMENT_OTHER): Payer: Self-pay

## 2022-12-06 DIAGNOSIS — R102 Pelvic and perineal pain: Secondary | ICD-10-CM | POA: Diagnosis not present

## 2022-12-06 DIAGNOSIS — R1030 Lower abdominal pain, unspecified: Secondary | ICD-10-CM | POA: Diagnosis present

## 2022-12-06 LAB — URINALYSIS, ROUTINE W REFLEX MICROSCOPIC
Bilirubin Urine: NEGATIVE
Glucose, UA: NEGATIVE mg/dL
Ketones, ur: NEGATIVE mg/dL
Nitrite: NEGATIVE
Protein, ur: NEGATIVE mg/dL
Specific Gravity, Urine: 1.025 (ref 1.005–1.030)
pH: 5.5 (ref 5.0–8.0)

## 2022-12-06 LAB — COMPREHENSIVE METABOLIC PANEL
ALT: 20 U/L (ref 0–44)
AST: 21 U/L (ref 15–41)
Albumin: 4 g/dL (ref 3.5–5.0)
Alkaline Phosphatase: 75 U/L (ref 38–126)
Anion gap: 10 (ref 5–15)
BUN: 9 mg/dL (ref 6–20)
CO2: 22 mmol/L (ref 22–32)
Calcium: 8.9 mg/dL (ref 8.9–10.3)
Chloride: 102 mmol/L (ref 98–111)
Creatinine, Ser: 0.51 mg/dL (ref 0.44–1.00)
GFR, Estimated: >60 mL/min (ref >60)
Glucose, Bld: 127 mg/dL — ABNORMAL HIGH (ref 70–99)
Potassium: 3.3 mmol/L — ABNORMAL LOW (ref 3.5–5.1)
Sodium: 134 mmol/L — ABNORMAL LOW (ref 135–145)
Total Bilirubin: 0.6 mg/dL (ref 0.3–1.2)
Total Protein: 7.3 g/dL (ref 6.5–8.1)

## 2022-12-06 LAB — CBC
HCT: 38.6 % (ref 36.0–46.0)
Hemoglobin: 12.1 g/dL (ref 12.0–15.0)
MCH: 20 pg — ABNORMAL LOW (ref 26.0–34.0)
MCHC: 31.3 g/dL (ref 30.0–36.0)
MCV: 63.8 fL — ABNORMAL LOW (ref 80.0–100.0)
Platelets: 313 10*3/uL (ref 150–400)
RBC: 6.05 MIL/uL — ABNORMAL HIGH (ref 3.87–5.11)
RDW: 18.1 % — ABNORMAL HIGH (ref 11.5–15.5)
WBC: 5.8 10*3/uL (ref 4.0–10.5)
nRBC: 0 % (ref 0.0–0.2)

## 2022-12-06 LAB — URINALYSIS, MICROSCOPIC (REFLEX)

## 2022-12-06 LAB — MAGNESIUM: Magnesium: 1.7 mg/dL (ref 1.7–2.4)

## 2022-12-06 LAB — PREGNANCY, URINE: Preg Test, Ur: NEGATIVE

## 2022-12-06 MED ORDER — KETOROLAC TROMETHAMINE 15 MG/ML IJ SOLN
15.0000 mg | Freq: Once | INTRAMUSCULAR | Status: AC
  Administered 2022-12-06: 15 mg via INTRAVENOUS

## 2022-12-06 MED ORDER — KETOROLAC TROMETHAMINE 30 MG/ML IJ SOLN
30.0000 mg | Freq: Once | INTRAMUSCULAR | Status: DC
  Filled 2022-12-06: qty 1

## 2022-12-06 MED ORDER — POTASSIUM CHLORIDE CRYS ER 20 MEQ PO TBCR
40.0000 meq | EXTENDED_RELEASE_TABLET | Freq: Once | ORAL | Status: AC
  Administered 2022-12-06: 40 meq via ORAL
  Filled 2022-12-06: qty 2

## 2022-12-06 MED ORDER — KETOROLAC TROMETHAMINE 10 MG PO TABS
10.0000 mg | ORAL_TABLET | Freq: Four times a day (QID) | ORAL | 0 refills | Status: AC | PRN
  Filled 2022-12-06: qty 20, 5d supply, fill #0

## 2022-12-06 MED ORDER — ONDANSETRON 4 MG PO TBDP
4.0000 mg | ORAL_TABLET | Freq: Three times a day (TID) | ORAL | 0 refills | Status: AC | PRN
  Filled 2022-12-06: qty 20, 7d supply, fill #0

## 2022-12-06 NOTE — ED Triage Notes (Signed)
C/O severe lower abdominal cramping,

## 2022-12-06 NOTE — ED Provider Notes (Signed)
[place] [facility] Provider Note   CSN: [id number] Arrival date & time: 12/06/22  1255     History  Chief Complaint  Patient presents with   Abdominal Cramping    Kristy Mitchell is a 22 y.o. female.   Abdominal Cramping   22 year old female presents emergency department with complaints of lower abdominal cramping.  Patient states that symptoms again last night.  Tried to take ibuprofen which did not help with symptoms.  Patient is emergency department for further assessment/evaluation.  Patient reports left greater than right lower abdominal cramping with some radiation to her back.  States it feels like menstrual cramping.  Pain is worse than normal.  Reports feelings of nausea with no emesis.  Denies any fever, chest pain, shortness of breath, urinary symptoms, vaginal symptoms, change in bowel habits.  Past medical history significant for hypertension pregnancy.  Home Medications Prior to Admission medications   Medication Sig Start Date End Date Taking? Authorizing Provider  ketorolac (TORADOL) 10 MG tablet Take 1 tablet (10 mg total) by mouth every 6 (six) hours as needed. 12/06/22  Yes Sherian Maroon A, PA  ondansetron (ZOFRAN-ODT) 4 MG disintegrating tablet Take 1 tablet (4 mg total) by mouth every 8 (eight) hours as needed. 12/06/22  Yes Sherian Maroon A, PA  famotidine (PEPCID) 40 MG tablet Take 1 tablet (40 mg total) by mouth at bedtime. 08/01/22   Lonell Grandchild, MD  oxyCODONE-acetaminophen (PERCOCET/ROXICET) 5-325 MG tablet Take 1 tablet by mouth every 8 (eight) hours as needed for severe pain. 10/07/22   Smoot, Shawn Route, PA-C  sucralfate (CARAFATE) 1 GM/10ML suspension Take 10 mLs (1 g total) by mouth 4 (four) times daily as needed (abdominal pain). 08/01/22   Lonell Grandchild, MD      Allergies    Patient has no known allergies.    Review of Systems   Review of Systems  All other systems reviewed and are  negative.   Physical Exam Updated Vital Signs BP 97/79 (BP Location: Right Arm)   Pulse 97   Temp 99 F (37.2 C)   Resp 18   Ht 5' (1.524 m)   Wt 80.7 kg   LMP 11/24/2022   SpO2 100%   BMI 34.76 kg/m  Physical Exam Vitals and nursing note reviewed.  Constitutional:      General: She is not in acute distress.    Appearance: She is well-developed.  HENT:     Head: Normocephalic and atraumatic.  Eyes:     Conjunctiva/sclera: Conjunctivae normal.  Cardiovascular:     Rate and Rhythm: Normal rate and regular rhythm.     Heart sounds: No murmur heard. Pulmonary:     Effort: Pulmonary effort is normal. No respiratory distress.     Breath sounds: Normal breath sounds.  Abdominal:     Palpations: Abdomen is soft.     Tenderness: There is abdominal tenderness. There is no right CVA tenderness, left CVA tenderness or guarding.     Comments: Suprapubic and left lower quadrant tenderness.  Musculoskeletal:        General: No swelling.     Cervical back: Neck supple.  Skin:    General: Skin is warm and dry.     Capillary Refill: Capillary refill takes less than 2 seconds.  Neurological:     Mental Status: She is alert.  Psychiatric:        Mood and Affect: Mood normal.  ED Results / Procedures / Treatments   Labs (all labs ordered are listed, but only abnormal results are displayed) Labs Reviewed  URINALYSIS, ROUTINE W REFLEX MICROSCOPIC - Abnormal; Notable for the following components:      Result Value   APPearance HAZY (*)    Hgb urine dipstick TRACE (*)    Leukocytes,Ua SMALL (*)    All other components within normal limits  COMPREHENSIVE METABOLIC PANEL - Abnormal; Notable for the following components:   Sodium 134 (*)    Potassium 3.3 (*)    Glucose, Bld 127 (*)    All other components within normal limits  CBC - Abnormal; Notable for the following components:   RBC 6.05 (*)    MCV 63.8 (*)    MCH 20.0 (*)    RDW 18.1 (*)    All other components within  normal limits  URINALYSIS, MICROSCOPIC (REFLEX) - Abnormal; Notable for the following components:   Bacteria, UA FEW (*)    All other components within normal limits  PREGNANCY, URINE  MAGNESIUM    EKG None  Radiology US PELVIC COMPLETE W TRANSVAGINAL AND TORSION R/O  Result Date: 12/06/2022 CLINICAL DATA:  Pelvic pain EXAM: TRANSABDOMINAL AND TRANSVAGINAL ULTRASOUND OF PELVIS TECHNIQUE: Both transabdominal and transvaginal ultrasound examinations of the pelvis were performed. Transabdominal technique was performed for global imaging of the pelvis including uterus, ovaries, adnexal regions, and pelvic cul-de-sac. It was necessary to proceed with endovaginal exam following the transabdominal exam to visualize the adnexal structures. COMPARISON:  None Available. FINDINGS: Uterus Measurements: 8.4 x 4.1 x 4.3 cm = volume: 77.5 mL. No fibroids or other mass visualized. Endometrium Thickness: 8.5 mm.  No focal abnormality visualized. Right ovary Measurements: 3.0 x 2.2 x 2.5 cm = volume: 8.6 mL. Normal appearance/no adnexal mass. Left ovary Measurements: 2.7 x 2.1 x 2.1 cm = volume: 6.1 mL. Normal appearance/no adnexal mass. Other findings No abnormal free fluid. IMPRESSION: Unremarkable pelvic ultrasound. Electronically Signed   By: Annia Belt M.D.   On: 12/06/2022 14:20    Procedures Procedures    Medications Ordered in ED Medications  ketorolac (TORADOL) 15 MG/ML injection 15 mg (15 mg Intravenous Given 12/06/22 1344)  potassium chloride SA (KLOR-CON M) CR tablet 40 mEq (40 mEq Oral Given 12/06/22 1412)    ED Course/ Medical Decision Making/ A&P                                 Medical Decision Making Amount and/or Complexity of Data Reviewed Labs: ordered.   This patient presents to the ED for concern of abdominal pain, this involves an extensive number of treatment options, and is a complaint that carries with it a high risk of complications and morbidity.  The differential  diagnosis includes gastritis, PUD, cholecystitis, CBD pathology, gastritis, SBO/LBO, volvulus, diverticulitis, anxiety/nephrolithiasis, cystitis, tubo-ovarian abscess, pregnancy, ovarian torsion, PID, other   Co morbidities that complicate the patient evaluation  See HPI   Additional history obtained:  Additional history obtained from EMR External records from outside source obtained and reviewed including hospital records   Lab Tests:  I Ordered, and personally interpreted labs.  The pertinent results include: No leukocytosis.  No evidence of anemia.  Platelets within range.  Mild hyponatremia/hypokalemia 134 and 3.2 respectively, otherwise electrolytes within normal limits.  No transaminitis.  No renal dysfunction.  UA significant for contaminated sample with 6-10 squamous epithelial cells glucosuria, 6-10 WBCs and small  leukocytes.   Imaging Studies ordered:  I ordered imaging studies including pelvic ultrasound I independently visualized and interpreted imaging which showed no acute abnormality I agree with the radiologist interpretation   Cardiac Monitoring: / EKG:  The patient was maintained on a cardiac monitor.  I personally viewed and interpreted the cardiac monitored which showed an underlying rhythm of: Sinus rhythm   Consultations Obtained:  N/a   Problem List / ED Course / Critical interventions / Medication management  Pelvic cramping I ordered medication including Toradol potassium chloride   Reevaluation of the patient after these medicines showed that the patient resolved I have reviewed the patients home medicines and have made adjustments as needed   Social Determinants of Health:  Denies tobacco or illicit drug use  Test / Admission - Considered:  Pelvic cramping Vitals signs within normal range and stable throughout visit. Laboratory/imaging studies significant for: See above 22 year old female presents emergency department with lower  abdominal cramping type sensation.  Patient reports symptoms beginning late last night and similar experience to menstrual cramping in the past.  On exam, patient with suprapubic/pelvic tenderness as well as adnexal tenderness.  Workup reassuring.  Labs consistent with slight Electra abnormalities.  Pelvic ultrasound normal.  Reassessment of the patient showed resolution of symptoms with administration of Toradol.  Low suspicion for nephrolithiasis given lack of CVA tenderness or RBCs in urine.  Patient without urinary symptoms with only small leukocytes, few bacteria and squamous epithelial cells, will refrain from empiric treatment of UTI given lack of symptoms and contaminated sample.  Offered patient CT imaging of the abdomen given reassuring pelvic ultrasound but she declined in favor of treatment of pain at home with Toradol given improvement.  Discussed at length.  Treatment plan discussed with patient and patient acknowledged understanding and was agreeable to said plan.  Patient overall well-appearing in no acute distress, tolerating p.o. without difficulty Worrisome signs and symptoms were discussed with the patient, and the patient acknowledged understanding to return to the ED if noticed. Patient was stable upon discharge.          Final Clinical Impression(s) / ED Diagnoses Final diagnoses:  Pelvic cramping    Rx / DC Orders ED Discharge Orders          Ordered    ketorolac (TORADOL) 10 MG tablet  Every 6 hours PRN        12/06/22 1427    ondansetron (ZOFRAN-ODT) 4 MG disintegrating tablet  Every 8 hours PRN        12/06/22 1427              Peter Garter, [place] 12/06/22 1504    Melene Plan, DO 12/07/22 [phone]

## 2022-12-06 NOTE — Discharge Instructions (Signed)
As discussed, workup today overall reassuring.  Labs showed slightly low potassium, otherwise reassuring.  Ultrasound did not show signs of ovarian mass, torsion or other abnormalities we discussed.  Recommend continue use of Toradol at home as well as Zofran as needed for nausea/vomiting.  If you change your mind about additional imaging/workup, please return to emergency department for further evaluation/assessment.  Otherwise, recommend follow-up with primary care for reassessment of your symptoms.

## 2023-02-17 ENCOUNTER — Emergency Department (HOSPITAL_BASED_OUTPATIENT_CLINIC_OR_DEPARTMENT_OTHER)
Admission: EM | Admit: 2023-02-17 | Discharge: 2023-02-17 | Disposition: A | Discharge status: Home / Self Care | Payer: Medicaid Other | Attending: Emergency Medicine | Admitting: Emergency Medicine

## 2023-02-17 ENCOUNTER — Emergency Department (HOSPITAL_BASED_OUTPATIENT_CLINIC_OR_DEPARTMENT_OTHER): Payer: Medicaid Other

## 2023-02-17 ENCOUNTER — Encounter (HOSPITAL_BASED_OUTPATIENT_CLINIC_OR_DEPARTMENT_OTHER): Payer: Self-pay

## 2023-02-17 ENCOUNTER — Other Ambulatory Visit: Payer: Self-pay

## 2023-02-17 DIAGNOSIS — R1011 Right upper quadrant pain: Secondary | ICD-10-CM | POA: Insufficient documentation

## 2023-02-17 DIAGNOSIS — R1031 Right lower quadrant pain: Secondary | ICD-10-CM | POA: Insufficient documentation

## 2023-02-17 DIAGNOSIS — R110 Nausea: Secondary | ICD-10-CM | POA: Diagnosis not present

## 2023-02-17 DIAGNOSIS — R051 Acute cough: Secondary | ICD-10-CM | POA: Insufficient documentation

## 2023-02-17 DIAGNOSIS — R0981 Nasal congestion: Secondary | ICD-10-CM | POA: Diagnosis not present

## 2023-02-17 DIAGNOSIS — R109 Unspecified abdominal pain: Secondary | ICD-10-CM

## 2023-02-17 DIAGNOSIS — J189 Pneumonia, unspecified organism: Secondary | ICD-10-CM

## 2023-02-17 LAB — URINALYSIS, MICROSCOPIC (REFLEX)

## 2023-02-17 LAB — COMPREHENSIVE METABOLIC PANEL
ALT: 17 U/L (ref 0–44)
AST: 19 U/L (ref 15–41)
Albumin: 3.9 g/dL (ref 3.5–5.0)
Alkaline Phosphatase: 76 U/L (ref 38–126)
Anion gap: 8 (ref 5–15)
BUN: 8 mg/dL (ref 6–20)
CO2: 21 mmol/L — ABNORMAL LOW (ref 22–32)
Calcium: 8.8 mg/dL — ABNORMAL LOW (ref 8.9–10.3)
Chloride: 105 mmol/L (ref 98–111)
Creatinine, Ser: 0.51 mg/dL (ref 0.44–1.00)
GFR, Estimated: >60 mL/min (ref >60)
Glucose, Bld: 95 mg/dL (ref 70–99)
Potassium: 3.6 mmol/L (ref 3.5–5.1)
Sodium: 134 mmol/L — ABNORMAL LOW (ref 135–145)
Total Bilirubin: 0.5 mg/dL (ref 0.0–1.2)
Total Protein: 8 g/dL (ref 6.5–8.1)

## 2023-02-17 LAB — PREGNANCY, URINE: Preg Test, Ur: NEGATIVE

## 2023-02-17 LAB — URINALYSIS, ROUTINE W REFLEX MICROSCOPIC
Bilirubin Urine: NEGATIVE
Glucose, UA: NEGATIVE mg/dL
Ketones, ur: NEGATIVE mg/dL
Leukocytes,Ua: NEGATIVE
Nitrite: NEGATIVE
Protein, ur: 30 mg/dL — AB
Specific Gravity, Urine: 1.025 (ref 1.005–1.030)
pH: 5.5 (ref 5.0–8.0)

## 2023-02-17 LAB — CBC
HCT: 38.2 % (ref 36.0–46.0)
Hemoglobin: 12.1 g/dL (ref 12.0–15.0)
MCH: 20.1 pg — ABNORMAL LOW (ref 26.0–34.0)
MCHC: 31.7 g/dL (ref 30.0–36.0)
MCV: 63.6 fL — ABNORMAL LOW (ref 80.0–100.0)
Platelets: 323 10*3/uL (ref 150–400)
RBC: 6.01 MIL/uL — ABNORMAL HIGH (ref 3.87–5.11)
RDW: 16.4 % — ABNORMAL HIGH (ref 11.5–15.5)
WBC: 5.4 10*3/uL (ref 4.0–10.5)
nRBC: 0 % (ref 0.0–0.2)

## 2023-02-17 LAB — LIPASE, BLOOD: Lipase: 27 U/L (ref 11–51)

## 2023-02-17 MED ORDER — MORPHINE SULFATE (PF) 4 MG/ML IV SOLN
4.0000 mg | Freq: Once | INTRAVENOUS | Status: AC
  Administered 2023-02-17: 4 mg via INTRAVENOUS
  Filled 2023-02-17: qty 1

## 2023-02-17 MED ORDER — KETOROLAC TROMETHAMINE 15 MG/ML IJ SOLN
15.0000 mg | Freq: Once | INTRAMUSCULAR | Status: DC
  Filled 2023-02-17: qty 1

## 2023-02-17 MED ORDER — IOHEXOL 300 MG/ML  SOLN
100.0000 mL | Freq: Once | INTRAMUSCULAR | Status: AC | PRN
  Administered 2023-02-17: 75 mL via INTRAVENOUS

## 2023-02-17 MED ORDER — FENTANYL CITRATE PF 50 MCG/ML IJ SOSY
25.0000 ug | PREFILLED_SYRINGE | Freq: Once | INTRAMUSCULAR | Status: DC
  Filled 2023-02-17: qty 1

## 2023-02-17 MED ORDER — ONDANSETRON HCL 4 MG/2ML IJ SOLN
4.0000 mg | Freq: Once | INTRAMUSCULAR | Status: AC
  Administered 2023-02-17: 4 mg via INTRAVENOUS
  Filled 2023-02-17: qty 2

## 2023-02-17 MED ORDER — MORPHINE SULFATE (PF) 4 MG/ML IV SOLN: 4.0000 mg | Freq: Once | INTRAVENOUS | Status: DC

## 2023-02-17 MED ORDER — DOXYCYCLINE HYCLATE 100 MG PO CAPS: 100.0000 mg | ORAL_CAPSULE | Freq: Two times a day (BID) | ORAL | 0 refills | Status: AC

## 2023-02-17 NOTE — ED Triage Notes (Signed)
 The patient having right side abd pain for two days with nausea. Urgent Care sent patient for eval.

## 2023-02-17 NOTE — Discharge Instructions (Signed)
 You are seen in the emergency department today with concerns of abdominal pain.  Your labs and imaging were thankfully reassuring without any obvious abnormality seen.  Your CT scan however did find evidence of left lower lobe pneumonia.  I sent doxycycline  to pharmacy for you to take for the next week.  Please take this as prescribed.  Return to the emergency department if you have any worsening in symptoms or new symptoms arise.

## 2023-02-17 NOTE — ED Notes (Signed)
 Patient transported to CT

## 2023-02-17 NOTE — ED Provider Notes (Signed)
 Patient is a handoff from Roemhildt, PA-C. In short, patient sent in from UC due to concerns of RLQ TTP and here for appendicitis r/o. History of tubal ligation. Otherwise well appearing. Plan is for CTAP results for dispo. Suspect likely menstrual discomfort and can discharge home. Physical Exam  BP 137/88   Pulse 100   Temp 98 F (36.7 C) (Oral)   Resp 14   Ht 5' (1.524 m)   Wt 80 kg   LMP 02/17/2023 (Exact Date)   SpO2 100%   Breastfeeding No   BMI 34.44 kg/m   Physical Exam Vitals and nursing note reviewed.  Constitutional:      Appearance: Normal appearance.  HENT:     Head: Normocephalic and atraumatic.  Eyes:     Conjunctiva/sclera: Conjunctivae normal.  Cardiovascular:     Rate and Rhythm: Normal rate and regular rhythm.  Pulmonary:     Effort: Pulmonary effort is normal. No respiratory distress.     Breath sounds: Normal breath sounds.  Abdominal:     General: There is no distension.     Palpations: Abdomen is soft.     Tenderness: There is no abdominal tenderness.  Skin:    General: Skin is warm and dry.  Neurological:     General: No focal deficit present.     Mental Status: She is alert.     Procedures  Procedures  ED Course / MDM    Medical Decision Making Amount and/or Complexity of Data Reviewed Labs: ordered. Radiology: ordered.  Risk Prescription drug management.   Patient is a handoff from Roemhildt, PA-C. In short, patient sent in from UC due to concerns of RLQ TTP and here for appendicitis r/o. History of tubal ligation. Otherwise well appearing. Plan is for CTAP results for dispo. Suspect likely menstrual discomfort and can discharge home.  CT abdomen pelvis is unremarkable at this time for any acute abnormality such as appendicitis or abdominal condition.  There are some concern for findings of left lower lobe pneumonia.  Will start patient on course of doxycycline .  Patient's pain appears to be improving she is refusing any other pain  medication at this time.  No focal tenderness on my exam.  Doubt torsion.  Discussed management of pneumonia and return precautions such as new or worsening symptoms.  No other acute focal concerns at this time.  Patient discharged home in stable condition.     Abijah Roussel A, PA-C 02/17/23 2005    Francesca Elsie CROME, MD 02/18/23 1520

## 2023-02-17 NOTE — ED Notes (Signed)
 Still not back in room

## 2023-02-17 NOTE — ED Provider Notes (Signed)
 [place] [facility] AT [facility] Provider Note   CSN: [id number] Arrival date & time: 02/17/23  1028     History  Chief Complaint  Patient presents with   Abdominal Pain    Kristy Mitchell is a 23 y.o. female with history of tubal ligation, who presents the emergency department complaining of right sided abdominal pain and nausea for the past 2 days.  Patient initially had a cough, which she describes as wet, but is not productive.  Some nasal congestion as well.  She originally went to urgent care and says that they tested her for several different things but they were all negative.  When they were pressing on her stomach they noted it to be very tender on the right side, and so they sent her to the ER for further evaluation.  Patient just darted her menstrual cycle yesterday and states that she has had very heavy bleeding with large clots, which is not normal for her.  The pain she is describing today is more so in the right lower quadrant, with some in the right upper quadrant.  She says it does not feel like period cramps.  She did have a little bit of dysuria when giving a urine sample here in the ER.  She has had significant nausea but no vomiting.   Abdominal Pain Associated symptoms: chills, cough, fever, nausea and vaginal bleeding        Home Medications Prior to Admission medications   Medication Sig Start Date End Date Taking? Authorizing Provider  famotidine  (PEPCID ) 40 MG tablet Take 1 tablet (40 mg total) by mouth at bedtime. 08/01/22   Francesca Elsie CROME, MD  ketorolac  (TORADOL ) 10 MG tablet Take 1 tablet (10 mg total) by mouth every 6 (six) hours as needed. 12/06/22   Silver Wonda LABOR, PA  ondansetron  (ZOFRAN -ODT) 4 MG disintegrating tablet Take 1 tablet (4 mg total) by mouth every 8 (eight) hours as needed. 12/06/22   Silver Wonda LABOR, PA  oxyCODONE -acetaminophen  (PERCOCET/ROXICET) 5-325 MG tablet Take 1 tablet by mouth every 8 (eight)  hours as needed for severe pain. 10/07/22   Smoot, Lauraine LABOR, PA-C  sucralfate  (CARAFATE ) 1 GM/10ML suspension Take 10 mLs (1 g total) by mouth 4 (four) times daily as needed (abdominal pain). 08/01/22   Francesca Elsie CROME, MD      Allergies    Patient has no known allergies.    Review of Systems   Review of Systems  Constitutional:  Positive for chills and fever.  HENT:  Positive for congestion.   Respiratory:  Positive for cough.   Gastrointestinal:  Positive for abdominal pain and nausea.  Genitourinary:  Positive for vaginal bleeding.  All other systems reviewed and are negative.   Physical Exam Updated Vital Signs BP 137/88   Pulse 100   Temp 98 F (36.7 C) (Oral)   Resp 14   Ht 5' (1.524 m)   Wt 80 kg   LMP 02/17/2023 (Exact Date)   SpO2 100%   Breastfeeding No   BMI 34.44 kg/m  Physical Exam Vitals and nursing note reviewed.  Constitutional:      Appearance: Normal appearance.  HENT:     Head: Normocephalic and atraumatic.  Eyes:     Conjunctiva/sclera: Conjunctivae normal.  Cardiovascular:     Rate and Rhythm: Normal rate and regular rhythm.  Pulmonary:     Effort: Pulmonary effort is normal. No respiratory distress.     Breath sounds: Normal  breath sounds.  Abdominal:     General: There is no distension.     Palpations: Abdomen is soft.     Tenderness: There is abdominal tenderness in the right upper quadrant and right lower quadrant. There is guarding.     Comments: RLQ > RUQ  Skin:    General: Skin is warm and dry.  Neurological:     General: No focal deficit present.     Mental Status: She is alert.     ED Results / Procedures / Treatments   Labs (all labs ordered are listed, but only abnormal results are displayed) Labs Reviewed  COMPREHENSIVE METABOLIC PANEL - Abnormal; Notable for the following components:      Result Value   Sodium 134 (*)    CO2 21 (*)    Calcium 8.8 (*)    All other components within normal limits  CBC - Abnormal;  Notable for the following components:   RBC 6.01 (*)    MCV 63.6 (*)    MCH 20.1 (*)    RDW 16.4 (*)    All other components within normal limits  URINALYSIS, ROUTINE W REFLEX MICROSCOPIC - Abnormal; Notable for the following components:   Hgb urine dipstick LARGE (*)    Protein, ur 30 (*)    All other components within normal limits  URINALYSIS, MICROSCOPIC (REFLEX) - Abnormal; Notable for the following components:   Bacteria, UA FEW (*)    All other components within normal limits  LIPASE, BLOOD  PREGNANCY, URINE    EKG None  Radiology No results found.  Procedures Procedures    Medications Ordered in ED Medications  morphine  (PF) 4 MG/ML injection 4 mg (4 mg Intravenous Given 02/17/23 1552)  ondansetron  (ZOFRAN ) injection 4 mg (4 mg Intravenous Given 02/17/23 1549)  iohexol  (OMNIPAQUE ) 300 MG/ML solution 100 mL (75 mLs Intravenous Contrast Given 02/17/23 1654)    ED Course/ Medical Decision Making/ A&P                                 Medical Decision Making Amount and/or Complexity of Data Reviewed Labs: ordered. Radiology: ordered.  Risk Prescription drug management.   This patient is a 23 y.o. female  who presents to the ED for concern of right sided abdominal pain x 2 days, plus cough.   Differential diagnoses prior to evaluation: The emergent differential diagnosis includes, but is not limited to,  appendicitis, DKA, gastroenteritis, nephrolithiasis, pancreatitis, constipation, UTI, bowel obstruction, biliary disease, IBD, PUD, hepatitis, ectopic pregnancy, ovarian torsion, PID. This is not an exhaustive differential.   Past Medical History / Co-morbidities / Social History: Tubal ligation  Additional history: Chart reviewed. Pertinent results include: cannot review urgent care visit note  Physical Exam: Physical exam performed. The pertinent findings include: Mildly tachycardic, otherwise normal vital signs.  No acute distress.  Abdomen soft, with right  sided abdominal tenderness to palpation, right lower quadrant worse than upper, with some guarding.  Lab Tests/Imaging studies: I personally interpreted labs/imaging and the pertinent results include: No leukocytosis, normal hemoglobin.  CMP unremarkable.  UA with large hemoglobin, likely contaminated for menstrual cycle, as well as calcium oxalate crystals.  No evidence of UTI.  Pregnancy negative.  Normal lipase.  CT abdomen pelvis pending at time of shift change.   Medications: I ordered medication including Zofran  and morphine .  I have reviewed the patients home medicines and have made adjustments as needed.  Disposition: Patient discussed and care transferred to Zelaya PA-C at shift change. Please see his/her note for further details regarding further ED course and disposition. Plan at time of handoff is follow up on CT scan.  Anticipate if normal, dc to home with treatment of viral URI.   Final Clinical Impression(s) / ED Diagnoses Final diagnoses:  Acute cough  Right sided abdominal pain    Rx / DC Orders ED Discharge Orders     None      Portions of this report may have been transcribed using voice recognition software. Every effort was made to ensure accuracy; however, inadvertent computerized transcription errors may be present.    Deshia Vanderhoof T, PA-C 02/17/23 1848    Francesca Elsie CROME, MD 02/18/23 [phone]

## 2023-02-17 NOTE — ED Notes (Signed)
 Patient is resting comfortably.

## 2023-02-17 NOTE — ED Notes (Signed)

## 2023-08-27 ENCOUNTER — Encounter (HOSPITAL_BASED_OUTPATIENT_CLINIC_OR_DEPARTMENT_OTHER): Payer: Self-pay | Admitting: Emergency Medicine

## 2023-08-27 ENCOUNTER — Emergency Department (HOSPITAL_BASED_OUTPATIENT_CLINIC_OR_DEPARTMENT_OTHER)
Admission: EM | Admit: 2023-08-27 | Discharge: 2023-08-27 | Disposition: A | Discharge status: Home / Self Care | Attending: Emergency Medicine | Admitting: Emergency Medicine

## 2023-08-27 DIAGNOSIS — N3000 Acute cystitis without hematuria: Secondary | ICD-10-CM | POA: Insufficient documentation

## 2023-08-27 DIAGNOSIS — R300 Dysuria: Secondary | ICD-10-CM | POA: Diagnosis present

## 2023-08-27 LAB — WET PREP, GENITAL
Clue Cells Wet Prep HPF POC: NONE SEEN
Sperm: NONE SEEN
Trich, Wet Prep: NONE SEEN
WBC, Wet Prep HPF POC: >=10 — AB (ref <10)
Yeast Wet Prep HPF POC: NONE SEEN

## 2023-08-27 LAB — COMPREHENSIVE METABOLIC PANEL WITH GFR
ALT: 10 U/L (ref 0–44)
AST: 19 U/L (ref 15–41)
Albumin: 4.1 g/dL (ref 3.5–5.0)
Alkaline Phosphatase: 78 U/L (ref 38–126)
Anion gap: 12 (ref 5–15)
BUN: 9 mg/dL (ref 6–20)
CO2: 21 mmol/L — ABNORMAL LOW (ref 22–32)
Calcium: 8.8 mg/dL — ABNORMAL LOW (ref 8.9–10.3)
Chloride: 104 mmol/L (ref 98–111)
Creatinine, Ser: 0.59 mg/dL (ref 0.44–1.00)
GFR, Estimated: >60 mL/min (ref >60)
Glucose, Bld: 82 mg/dL (ref 70–99)
Potassium: 4.2 mmol/L (ref 3.5–5.1)
Sodium: 137 mmol/L (ref 135–145)
Total Bilirubin: 0.4 mg/dL (ref 0.0–1.2)
Total Protein: 6.9 g/dL (ref 6.5–8.1)

## 2023-08-27 LAB — PREGNANCY, URINE: Preg Test, Ur: NEGATIVE

## 2023-08-27 LAB — URINALYSIS, ROUTINE W REFLEX MICROSCOPIC
Bilirubin Urine: NEGATIVE
Glucose, UA: NEGATIVE mg/dL
Ketones, ur: NEGATIVE mg/dL
Nitrite: NEGATIVE
Protein, ur: NEGATIVE mg/dL
Specific Gravity, Urine: 1.025 (ref 1.005–1.030)
pH: 6.5 (ref 5.0–8.0)

## 2023-08-27 LAB — CBC WITH DIFFERENTIAL/PLATELET
Abs Immature Granulocytes: 0.01 K/uL (ref 0.00–0.07)
Basophils Absolute: 0 K/uL (ref 0.0–0.1)
Basophils Relative: 0 %
Eosinophils Absolute: 0.1 K/uL (ref 0.0–0.5)
Eosinophils Relative: 1 %
HCT: 37.7 % (ref 36.0–46.0)
Hemoglobin: 12.1 g/dL (ref 12.0–15.0)
Immature Granulocytes: 0 %
Lymphocytes Relative: 48 %
Lymphs Abs: 3.2 K/uL (ref 0.7–4.0)
MCH: 21.2 pg — ABNORMAL LOW (ref 26.0–34.0)
MCHC: 32.1 g/dL (ref 30.0–36.0)
MCV: 65.9 fL — ABNORMAL LOW (ref 80.0–100.0)
Monocytes Absolute: 0.4 K/uL (ref 0.1–1.0)
Monocytes Relative: 6 %
Neutro Abs: 3 K/uL (ref 1.7–7.7)
Neutrophils Relative %: 45 %
Platelets: 313 K/uL (ref 150–400)
RBC: 5.72 MIL/uL — ABNORMAL HIGH (ref 3.87–5.11)
RDW: 16.7 % — ABNORMAL HIGH (ref 11.5–15.5)
WBC: 6.8 K/uL (ref 4.0–10.5)
nRBC: 0 % (ref 0.0–0.2)

## 2023-08-27 LAB — URINALYSIS, MICROSCOPIC (REFLEX)

## 2023-08-27 MED ORDER — NITROFURANTOIN MONOHYD MACRO 100 MG PO CAPS
100.0000 mg | ORAL_CAPSULE | Freq: Two times a day (BID) | ORAL | 0 refills | Status: AC
Start: 2023-08-27 — End: 2023-09-01

## 2023-08-27 MED ORDER — NITROFURANTOIN MONOHYD MACRO 100 MG PO CAPS
100.0000 mg | ORAL_CAPSULE | Freq: Once | ORAL | Status: AC
  Administered 2023-08-27: 100 mg via ORAL
  Filled 2023-08-27: qty 1

## 2023-08-27 MED ORDER — LIDOCAINE HCL (PF) 1 % IJ SOLN: 1.2000 mL | Freq: Once | INTRAMUSCULAR | Status: AC

## 2023-08-27 MED ORDER — LIDOCAINE HCL (PF) 1 % IJ SOLN
INTRAMUSCULAR | Status: AC
  Administered 2023-08-27: 1.2 mL
  Filled 2023-08-27: qty 5

## 2023-08-27 MED ORDER — DOXYCYCLINE HYCLATE 100 MG PO CAPS: 100.0000 mg | ORAL_CAPSULE | Freq: Two times a day (BID) | ORAL | 0 refills | Status: AC

## 2023-08-27 MED ORDER — DOXYCYCLINE HYCLATE 100 MG PO TABS
100.0000 mg | ORAL_TABLET | Freq: Once | ORAL | Status: AC
  Administered 2023-08-27: 100 mg via ORAL
  Filled 2023-08-27: qty 1

## 2023-08-27 MED ORDER — SODIUM CHLORIDE 0.9 % IV SOLN: 1.0000 g | Freq: Once | INTRAVENOUS | Status: DC

## 2023-08-27 MED ORDER — CEFTRIAXONE SODIUM 500 MG IJ SOLR
500.0000 mg | Freq: Once | INTRAMUSCULAR | Status: AC
  Administered 2023-08-27: 500 mg via INTRAMUSCULAR
  Filled 2023-08-27: qty 500

## 2023-08-27 NOTE — ED Notes (Signed)
 Pt in no acute distress, instructed to disrobe underneath the gown and staff will be in shortly for pelvic.

## 2023-08-27 NOTE — ED Triage Notes (Signed)
 Pt c/o vaginal burning x 3d and pelvic pain; recently dx with endometriosis; reports light bleeding that started today

## 2023-08-27 NOTE — ED Provider Notes (Signed)
 [place] [facility] Provider Note   CSN: [id number] Arrival date & time: 08/27/23  1048     Patient presents with: Vaginal Bleeding   Kristy Mitchell is a 23 y.o. female with no significant past medical history presents with concern for pelvic pain and dysuria that started 3 days ago.  Also reports some hematuria but no increased frequency.  Reports she was having pain with sexual intercourse a couple days ago as well and noticed an abnormal mucousy vaginal discharge.  Denies any abdominal pain, fever, chills, nausea or vomiting.  Denies any flank pain.    Vaginal Bleeding      Prior to Admission medications   Medication Sig Start Date End Date Taking? Authorizing Provider  doxycycline  (VIBRAMYCIN ) 100 MG capsule Take 1 capsule (100 mg total) by mouth 2 (two) times daily for 7 days. 08/27/23 09/03/23 Yes Veta Palma, PA-C  nitrofurantoin , macrocrystal-monohydrate, (MACROBID ) 100 MG capsule Take 1 capsule (100 mg total) by mouth 2 (two) times daily for 5 days. 08/27/23 09/01/23 Yes Veta Palma, PA-C  famotidine  (PEPCID ) 40 MG tablet Take 1 tablet (40 mg total) by mouth at bedtime. 08/01/22   Francesca Elsie CROME, MD  ketorolac  (TORADOL ) 10 MG tablet Take 1 tablet (10 mg total) by mouth every 6 (six) hours as needed. 12/06/22   Silver Wonda LABOR, PA  ondansetron  (ZOFRAN -ODT) 4 MG disintegrating tablet Take 1 tablet (4 mg total) by mouth every 8 (eight) hours as needed. 12/06/22   Silver Wonda LABOR, PA  oxyCODONE -acetaminophen  (PERCOCET/ROXICET) 5-325 MG tablet Take 1 tablet by mouth every 8 (eight) hours as needed for severe pain. 10/07/22   Smoot, Lauraine LABOR, PA-C  sucralfate  (CARAFATE ) 1 GM/10ML suspension Take 10 mLs (1 g total) by mouth 4 (four) times daily as needed (abdominal pain). 08/01/22   Francesca Elsie CROME, MD    Allergies: Patient has no known allergies.    Review of Systems  Genitourinary:  Positive for vaginal bleeding.     Updated Vital Signs BP 123/63 (BP Location: Right Arm)   Pulse 86   Temp 97.8 F (36.6 C)   Resp 18   Ht 5' 1 (1.549 m)   Wt 77.1 kg   SpO2 100%   BMI 32.12 kg/m   Physical Exam Vitals and nursing note reviewed. Exam conducted with a chaperone present.  Constitutional:      Appearance: Normal appearance.  HENT:     Head: Atraumatic.  Cardiovascular:     Rate and Rhythm: Normal rate and regular rhythm.  Pulmonary:     Effort: Pulmonary effort is normal.  Abdominal:     Comments: Abdomen soft nontender to palpation.  No CVA tenderness bilaterally  Genitourinary:    Comments: NT chaperone present for GU exam  Patient without any erythema or lesions of the external genitalia.  Patient with moderate amount of yellow/white vaginal discharge.  Has significant pain with insertion of speculum and pelvic exam.  Has cervical motion tenderness Neurological:     General: No focal deficit present.     Mental Status: She is alert.  Psychiatric:        Mood and Affect: Mood normal.        Behavior: Behavior normal.     (all labs ordered are listed, but only abnormal results are displayed) Labs Reviewed  WET PREP, GENITAL - Abnormal; Notable for the following components:      Result Value   WBC, Wet Prep HPF POC >=10 (*)  All other components within normal limits  CBC WITH DIFFERENTIAL/PLATELET - Abnormal; Notable for the following components:   RBC 5.72 (*)    MCV 65.9 (*)    MCH 21.2 (*)    RDW 16.7 (*)    All other components within normal limits  COMPREHENSIVE METABOLIC PANEL WITH GFR - Abnormal; Notable for the following components:   CO2 21 (*)    Calcium 8.8 (*)    All other components within normal limits  URINALYSIS, ROUTINE W REFLEX MICROSCOPIC - Abnormal; Notable for the following components:   Hgb urine dipstick TRACE (*)    Leukocytes,Ua MODERATE (*)    All other components within normal limits  URINALYSIS, MICROSCOPIC (REFLEX) - Abnormal; Notable for the  following components:   Bacteria, UA MANY (*)    All other components within normal limits  URINE CULTURE  PREGNANCY, URINE  GC/CHLAMYDIA PROBE AMP ([place]) NOT AT [facility]    EKG: None  Radiology: No results found.   Procedures   Medications Ordered in the ED  doxycycline  (VIBRA -TABS) tablet 100 mg (100 mg Oral Given 08/27/23 1342)  cefTRIAXone  (ROCEPHIN ) injection 500 mg (500 mg Intramuscular Given 08/27/23 1343)  nitrofurantoin  (macrocrystal-monohydrate) (MACROBID ) capsule 100 mg (100 mg Oral Given 08/27/23 1342)  lidocaine  (PF) (XYLOCAINE ) 1 % injection 1.2 mL (1.2 mLs Other Given 08/27/23 1343)                                    Medical Decision Making Amount and/or Complexity of Data Reviewed Labs: ordered.  Risk Prescription drug management.     Differential diagnosis includes but is not limited to STI, bacterial vaginosis, yeast infection, PID, UTI, pyelonephritis, pregnancy related concern  ED Course:  Upon initial evaluation, patient is well-appearing, no acute distress.  Stable vitals.  Abdomen soft and nontender to palpation.  No CVA tenderness bilaterally.  On GU exam with NT chaperone present, patient with moderate amount of yellowish discharge in the vaginal canal.  Significant discomfort with speculum exam and has cervical motion tenderness.  No external genitalia erythema or lesions.  Labs Ordered: I Ordered, and personally interpreted labs.  The pertinent results include:   CBC without leukocytosis CMP without elevation in creatinine or LFTs Urinalysis with trace hemoglobin, moderate amount of leukocytes.  No nitrites.  No squamous epithelial cells Pregnancy negative Wet prep with no yeast, trichomoniasis, or BV seen.  White blood cells noted. GC pending  Medications Given: IM ceftriaxone  Doxycycline  Macrobid   Upon re-evaluation, patient remains well-appearing with stable vitals.  Urine shows mild amount of leukocytes and appears to be a  clean-catch, given patient's dysuria, will treat for urinary tract infection with nitrofurantoin .  She is not having any flank pain, no leukocytosis, low concern for pyelonephritis.  Will also send urine off for culture.  Wet prep without yeast, trichomoniasis, or BV.  White blood cells are noted on wet prep.  GC still pending.  Given patient's yellowish appearing discharge and discomfort on exam, will cover for possible PID from gonorrhea and/or chlamydia with IM ceftriaxone  here and course of doxycycline  at home.  She is negative for BV and trichomoniasis, will not add on metronidazole  at this time.  Do not feel like she needs inpatient admission at this time.  Low concern for other acute intra-abdominal pathology given abdomen soft nontender, no elevation in LFTs or creatinine, no leukocytosis, no fever or tachycardia.    Impression: Acute  cystitis Possible PID  Disposition:  The patient was discharged home with instructions to take course of metronidazole  and doxycycline  as prescribed.  Follow-up with PCP if symptoms not starting to improve within the next 3 days. Return precautions given.    This chart was dictated using voice recognition software, Dragon. Despite the best efforts of this provider to proofread and correct errors, errors may still occur which can change documentation meaning.       Final diagnoses:  Acute cystitis without hematuria    ED Discharge Orders          Ordered    doxycycline  (VIBRAMYCIN ) 100 MG capsule  2 times daily        08/27/23 1403    nitrofurantoin , macrocrystal-monohydrate, (MACROBID ) 100 MG capsule  2 times daily        08/27/23 1403               Veta Palma, PA-C 08/27/23 1416    Tegeler, Lonni PARAS, MD 08/27/23 [phone]

## 2023-08-27 NOTE — ED Notes (Signed)
 Discharge paperwork reviewed entirely with patient, including follow up care. Pain was under control. The patient received instruction and coaching on their prescriptions, and all follow-up questions were answered.  Pt verbalized understanding as well as all parties involved. No questions or concerns voiced at the time of discharge. No acute distress noted. Pt was encouraged to stay adequately hydrated and eat a healthy diet.   Pt was wheeled out to the PVA in a wheelchair without incident.  Pt advised they will seek followup care with a specialist and followup with their PCP.   The pt was instructed to set up and/or review MyChart for their results; and was informed their Providers all have access to the information as well.

## 2023-08-27 NOTE — Discharge Instructions (Addendum)
 You have been prescribed nitrofurantoin  (Macrobid ) for a urinary tract infection. Take this antibiotic 2 times a day for the next 5 days.  You were given your first dose here today.  You may take your next dose later this evening.  Take the full course of your antibiotic even if you start feeling better. Antibiotics may cause you to have diarrhea.  You have been prescribed an antibiotic called doxycycline  to treat for a potential infection the the uterus/cervix/vaginal canal called pelvic inflammatory disease. Take this antibiotic 2 times a day for the next 7 days.  You were given your first dose here today.  You may take your next dose this evening.  Take the full course of your antibiotic even if you start feeling better. Antibiotics may cause you to have diarrhea.  You tested negative for yeast, BV, trichomoniasis today.  Your kidney and liver labs were normal today.  Your pregnancy test was negative.  Please follow-up with your PCP for further evaluation if your symptoms are not starting to improve within the next 3 days.  Please return to the emergency room for any worsening abdominal pain, fevers, persistent vomiting, any other new or concerning symptoms

## 2023-08-28 LAB — GC/CHLAMYDIA PROBE AMP (~~LOC~~) NOT AT ARMC
Chlamydia: NEGATIVE
Comment: NEGATIVE
Comment: NORMAL
Neisseria Gonorrhea: NEGATIVE

## 2023-08-29 LAB — URINE CULTURE: Culture: >=100000 — AB

## 2023-08-30 ENCOUNTER — Telehealth (HOSPITAL_BASED_OUTPATIENT_CLINIC_OR_DEPARTMENT_OTHER): Payer: Self-pay | Admitting: *Deleted

## 2023-08-30 NOTE — Telephone Encounter (Signed)
 Post ED Visit - Positive Culture Follow-up  Culture report reviewed by antimicrobial stewardship pharmacist: [facility] [x]  Koren Or, Pharm.D. []  Venetia Gully, Pharm.D., BCPS AQ-ID []  Garrel Crews, Pharm.D., BCPS []  Almarie Lunger, Pharm.D., BCPS []  [place], [place].D., BCPS, AAHIVP []  Rosaline Bihari, Pharm.D., BCPS, AAHIVP []  Vernell Meier, PharmD, BCPS []  Latanya Hint, PharmD, BCPS []  Donald Medley, PharmD, BCPS []  Rocky Bold, PharmD []  Dorothyann Alert, PharmD, BCPS []  Morene Babe, PharmD  [facility] []  Rosaline Edison, PharmD []  Romona Bliss, PharmD []  Dolphus Roller, PharmD []  Veva Seip, Rph []  Vernell Daunt) Leonce, PharmD []  Eva Allis, PharmD []  Rosaline Millet, PharmD []  Iantha Batch, PharmD []  Arvin Gauss, PharmD []  Wanda Hasting, PharmD []  Ronal Rav, PharmD []  Rocky Slade, PharmD []  Bard Jeans, PharmD   Positive urine culture Treated with nitrofurantoin , organism sensitive to the same and no further patient follow-up is required at this time.  Kristy Mitchell, Kristy Mitchell 08/30/2023, 11:14 AM

## 2023-10-11 ENCOUNTER — Encounter (HOSPITAL_BASED_OUTPATIENT_CLINIC_OR_DEPARTMENT_OTHER): Payer: Self-pay

## 2023-10-11 ENCOUNTER — Emergency Department (HOSPITAL_BASED_OUTPATIENT_CLINIC_OR_DEPARTMENT_OTHER)
Admission: EM | Admit: 2023-10-11 | Discharge: 2023-10-11 | Disposition: A | Discharge status: Home / Self Care | Attending: Emergency Medicine | Admitting: Emergency Medicine

## 2023-10-11 ENCOUNTER — Other Ambulatory Visit: Payer: Self-pay

## 2023-10-11 ENCOUNTER — Emergency Department (HOSPITAL_BASED_OUTPATIENT_CLINIC_OR_DEPARTMENT_OTHER)

## 2023-10-11 DIAGNOSIS — R102 Pelvic and perineal pain: Secondary | ICD-10-CM | POA: Insufficient documentation

## 2023-10-11 LAB — COMPREHENSIVE METABOLIC PANEL WITH GFR
ALT: 8 U/L (ref 0–44)
AST: 17 U/L (ref 15–41)
Albumin: 4.2 g/dL (ref 3.5–5.0)
Alkaline Phosphatase: 78 U/L (ref 38–126)
Anion gap: 12 (ref 5–15)
BUN: 8 mg/dL (ref 6–20)
CO2: 22 mmol/L (ref 22–32)
Calcium: 8.8 mg/dL — ABNORMAL LOW (ref 8.9–10.3)
Chloride: 103 mmol/L (ref 98–111)
Creatinine, Ser: 0.53 mg/dL (ref 0.44–1.00)
GFR, Estimated: >60 mL/min (ref >60)
Glucose, Bld: 98 mg/dL (ref 70–99)
Potassium: 4.2 mmol/L (ref 3.5–5.1)
Sodium: 137 mmol/L (ref 135–145)
Total Bilirubin: 0.5 mg/dL (ref 0.0–1.2)
Total Protein: 7 g/dL (ref 6.5–8.1)

## 2023-10-11 LAB — CBC WITH DIFFERENTIAL/PLATELET
Abs Immature Granulocytes: 0.01 K/uL (ref 0.00–0.07)
Basophils Absolute: 0 K/uL (ref 0.0–0.1)
Basophils Relative: 0 %
Eosinophils Absolute: 0.1 K/uL (ref 0.0–0.5)
Eosinophils Relative: 1 %
HCT: 38.7 % (ref 36.0–46.0)
Hemoglobin: 12.1 g/dL (ref 12.0–15.0)
Immature Granulocytes: 0 %
Lymphocytes Relative: 56 %
Lymphs Abs: 3.1 K/uL (ref 0.7–4.0)
MCH: 20.9 pg — ABNORMAL LOW (ref 26.0–34.0)
MCHC: 31.3 g/dL (ref 30.0–36.0)
MCV: 66.7 fL — ABNORMAL LOW (ref 80.0–100.0)
Monocytes Absolute: 0.4 K/uL (ref 0.1–1.0)
Monocytes Relative: 6 %
Neutro Abs: 2.1 K/uL (ref 1.7–7.7)
Neutrophils Relative %: 37 %
Platelets: 300 K/uL (ref 150–400)
RBC: 5.8 MIL/uL — ABNORMAL HIGH (ref 3.87–5.11)
RDW: 16.1 % — ABNORMAL HIGH (ref 11.5–15.5)
WBC: 5.7 K/uL (ref 4.0–10.5)
nRBC: 0 % (ref 0.0–0.2)

## 2023-10-11 LAB — URINALYSIS, ROUTINE W REFLEX MICROSCOPIC
Bilirubin Urine: NEGATIVE
Glucose, UA: NEGATIVE mg/dL
Hgb urine dipstick: NEGATIVE
Ketones, ur: NEGATIVE mg/dL
Nitrite: NEGATIVE
Protein, ur: NEGATIVE mg/dL
Specific Gravity, Urine: 1.02 (ref 1.005–1.030)
pH: 5.5 (ref 5.0–8.0)

## 2023-10-11 LAB — URINALYSIS, MICROSCOPIC (REFLEX)

## 2023-10-11 LAB — PREGNANCY, URINE: Preg Test, Ur: NEGATIVE

## 2023-10-11 MED ORDER — KETOROLAC TROMETHAMINE 15 MG/ML IJ SOLN
15.0000 mg | Freq: Once | INTRAMUSCULAR | Status: AC
  Administered 2023-10-11: 15 mg via INTRAVENOUS
  Filled 2023-10-11: qty 1

## 2023-10-11 NOTE — ED Provider Notes (Signed)
 [facility]  Provider Note  CSN: [id number] Arrival date & time: 10/11/23 9393  History Chief Complaint  Patient presents with   Abdominal Pain    Kristy Mitchell is a 23 y.o. female with history of endometriosis/pelvic pain reports onset of lower abdominal pain 2 days ago, some dysuria. No vaginal discharge. She had similar about 6 weeks ago with ED visit then and acute cystitis with E Coli. Treated with macrobid . She has also seen Gyn and is scheduled for hysterectomy in a few months. She denies any fever, vomiting or back pain.    Home Medications Prior to Admission medications   Medication Sig Start Date End Date Taking? Authorizing Provider  famotidine  (PEPCID ) 40 MG tablet Take 1 tablet (40 mg total) by mouth at bedtime. 08/01/22   Francesca Elsie CROME, MD  ketorolac  (TORADOL ) 10 MG tablet Take 1 tablet (10 mg total) by mouth every 6 (six) hours as needed. 12/06/22   Silver Wonda LABOR, PA  ondansetron  (ZOFRAN -ODT) 4 MG disintegrating tablet Take 1 tablet (4 mg total) by mouth every 8 (eight) hours as needed. 12/06/22   Silver Wonda LABOR, PA  oxyCODONE -acetaminophen  (PERCOCET/ROXICET) 5-325 MG tablet Take 1 tablet by mouth every 8 (eight) hours as needed for severe pain. 10/07/22   Smoot, Sarah A, PA-C  sucralfate  (CARAFATE ) 1 GM/10ML suspension Take 10 mLs (1 g total) by mouth 4 (four) times daily as needed (abdominal pain). 08/01/22   Francesca Elsie CROME, MD     Allergies    Patient has no known allergies.   Review of Systems   Review of Systems Please see HPI for pertinent positives and negatives  Physical Exam BP 128/84 (BP Location: Left Arm) Comment: Simultaneous filing. User may not have seen previous data.  Pulse 64   Temp 98.1 F (36.7 C) (Oral)   Resp 18   LMP 10/06/2023 (Exact Date)   SpO2 100%   Physical Exam Vitals and nursing note reviewed.  Constitutional:      Appearance: Normal appearance.  HENT:      Head: Normocephalic and atraumatic.     Nose: Nose normal.     Mouth/Throat:     Mouth: Mucous membranes are moist.  Eyes:     Extraocular Movements: Extraocular movements intact.     Conjunctiva/sclera: Conjunctivae normal.  Cardiovascular:     Rate and Rhythm: Normal rate.  Pulmonary:     Effort: Pulmonary effort is normal.     Breath sounds: Normal breath sounds.  Abdominal:     General: Abdomen is flat.     Palpations: Abdomen is soft.     Tenderness: There is abdominal tenderness in the suprapubic area and left lower quadrant. There is guarding. Negative signs include Murphy's sign and McBurney's sign.  Musculoskeletal:        General: No swelling. Normal range of motion.     Cervical back: Neck supple.  Skin:    General: Skin is warm and dry.  Neurological:     General: No focal deficit present.     Mental Status: She is alert.  Psychiatric:        Mood and Affect: Mood normal.     ED Results / Procedures / Treatments   EKG None  Procedures Procedures  Medications Ordered in the ED Medications - No data to display  Initial Impression and Plan  Patient here with lower abdomen/pelvic pain, tender to palpation. Vitals are reassuring. Will check labs.  Defer imaging depending on results. She declines pain medication.   ED Course   Clinical Course as of 10/11/23 0700  Wed Oct 11, 2023  0643 UA with small LE, few WBC and contaminated with squamous cells.  [CS]  [phone] Care of the patient signed out at shift change pending labs and re-evaluation.  [CS]    Clinical Course User Index [CS] Roselyn Carlin NOVAK, MD     MDM Rules/Calculators/A&P Medical Decision Making Problems Addressed: Pelvic pain in female: acute illness or injury  Amount and/or Complexity of Data Reviewed Labs: ordered. Decision-making details documented in ED Course.     Final Clinical Impression(s) / ED Diagnoses Final diagnoses:  Pelvic pain in female    Rx / DC Orders ED  Discharge Orders     None        Roselyn Carlin NOVAK, MD 10/11/23 0700

## 2023-10-11 NOTE — Discharge Instructions (Signed)
 If you develop worsening, continued, or recurrent abdominal pain, uncontrolled vomiting, fever, chest or back pain, or any other new/concerning symptoms then return to the ER for evaluation.

## 2023-10-11 NOTE — ED Provider Notes (Signed)
 7:58 AM Patient endorses continued lower abdominal pain.  Labs are reassuring, including normal WBC.  She has diffuse lower abdominal tenderness on my exam.  Vitals are reassuring.  Like something for pain at this point when asked, and would like to do an ultrasound.  Will get an ultrasound to rule out torsion.  Otherwise her urine is unremarkable and she denies dysuria and states this is different than when she had a UTI previously.  10:48 AM Patient is feeling better.  Vital signs remain reassuring.  Ultrasound is negative.  Given no localizing tenderness, my suspicion for appendicitis, diverticulitis, etc. is low.  Discussed potential CT scanning but through shared decision making we have decided to hold off.  Will have her follow-up with PCP.  Discussed and will give return precautions.   Kristy Hamilton, MD 10/11/23 1049

## 2023-10-11 NOTE — ED Notes (Signed)
 Patient transported to Ultrasound

## 2023-10-11 NOTE — ED Notes (Signed)
CBC recollected  

## 2023-10-11 NOTE — ED Triage Notes (Signed)
 Lower abdominal pain that began yesterday.   Says pain presented the same as it did a month ago.
